# Patient Record
Sex: Female | Born: 1985 | Hispanic: No | Marital: Married | Smoking: Never smoker
Health system: Southern US, Community
[De-identification: ages and names within clinical notes are randomized; demographics above are authoritative.]

## PROBLEM LIST (undated history)

## (undated) DIAGNOSIS — D696 Thrombocytopenia, unspecified: Secondary | ICD-10-CM

## (undated) DIAGNOSIS — O24419 Gestational diabetes mellitus in pregnancy, unspecified control: Secondary | ICD-10-CM

## (undated) HISTORY — PX: NO PAST SURGERIES: SHX2092

## (undated) HISTORY — DX: Thrombocytopenia, unspecified: D69.6

---

## 2008-04-28 ENCOUNTER — Ambulatory Visit (HOSPITAL_COMMUNITY): Admission: RE | Admit: 2008-04-28 | Discharge: 2008-04-28 | Payer: Self-pay | Admitting: Obstetrics & Gynecology

## 2009-03-26 ENCOUNTER — Inpatient Hospital Stay (HOSPITAL_COMMUNITY): Admission: AD | Admit: 2009-03-26 | Discharge: 2009-03-28 | Payer: Self-pay | Admitting: Obstetrics & Gynecology

## 2010-08-28 ENCOUNTER — Ambulatory Visit: Payer: Self-pay | Admitting: Gynecology

## 2010-08-28 ENCOUNTER — Inpatient Hospital Stay (HOSPITAL_COMMUNITY): Admission: AD | Admit: 2010-08-28 | Discharge: 2010-08-28 | Payer: Self-pay | Admitting: Family Medicine

## 2010-11-09 ENCOUNTER — Inpatient Hospital Stay (HOSPITAL_COMMUNITY)
Admission: AD | Admit: 2010-11-09 | Discharge: 2010-11-09 | Payer: Self-pay | Source: Home / Self Care | Attending: Obstetrics and Gynecology | Admitting: Obstetrics and Gynecology

## 2010-11-18 LAB — URINALYSIS, ROUTINE W REFLEX MICROSCOPIC
Bilirubin Urine: NEGATIVE
Ketones, ur: NEGATIVE mg/dL
Leukocytes, UA: NEGATIVE
Nitrite: NEGATIVE
Protein, ur: NEGATIVE mg/dL
Specific Gravity, Urine: 1.025 (ref 1.005–1.030)
Urine Glucose, Fasting: NEGATIVE mg/dL
Urobilinogen, UA: 0.2 mg/dL (ref 0.0–1.0)
pH: 6 (ref 5.0–8.0)

## 2010-11-18 LAB — CBC
HCT: 38.2 % (ref 36.0–46.0)
Hemoglobin: 12.7 g/dL (ref 12.0–15.0)
MCH: 27.1 pg (ref 26.0–34.0)
MCHC: 33.2 g/dL (ref 30.0–36.0)
MCV: 81.6 fL (ref 78.0–100.0)
Platelets: 207 10*3/uL (ref 150–400)
RBC: 4.68 MIL/uL (ref 3.87–5.11)
RDW: 12.9 % (ref 11.5–15.5)
WBC: 5.5 10*3/uL (ref 4.0–10.5)

## 2010-11-18 LAB — WET PREP, GENITAL
Clue Cells Wet Prep HPF POC: NONE SEEN
Trich, Wet Prep: NONE SEEN
Yeast Wet Prep HPF POC: NONE SEEN

## 2010-11-18 LAB — GC/CHLAMYDIA PROBE AMP, GENITAL
Chlamydia, DNA Probe: NEGATIVE
GC Probe Amp, Genital: NEGATIVE

## 2010-11-18 LAB — URINE MICROSCOPIC-ADD ON

## 2010-11-18 LAB — HCG, QUANTITATIVE, PREGNANCY: hCG, Beta Chain, Quant, S: 5380 m[IU]/mL — ABNORMAL HIGH (ref <5)

## 2011-01-15 LAB — HCG, QUANTITATIVE, PREGNANCY: hCG, Beta Chain, Quant, S: 24 m[IU]/mL — ABNORMAL HIGH (ref <5)

## 2011-01-15 LAB — URINALYSIS, ROUTINE W REFLEX MICROSCOPIC
Bilirubin Urine: NEGATIVE
Glucose, UA: NEGATIVE mg/dL
Ketones, ur: NEGATIVE mg/dL
Leukocytes, UA: NEGATIVE
Nitrite: NEGATIVE
Protein, ur: NEGATIVE mg/dL
Specific Gravity, Urine: 1.01 (ref 1.005–1.030)
Urobilinogen, UA: 0.2 mg/dL (ref 0.0–1.0)
pH: 6 (ref 5.0–8.0)

## 2011-01-15 LAB — HCG, SERUM, QUALITATIVE: Preg, Serum: POSITIVE — AB

## 2011-01-15 LAB — WET PREP, GENITAL
Clue Cells Wet Prep HPF POC: NONE SEEN
Trich, Wet Prep: NONE SEEN
Yeast Wet Prep HPF POC: NONE SEEN

## 2011-01-15 LAB — CBC
HCT: 39.6 % (ref 36.0–46.0)
Hemoglobin: 13.2 g/dL (ref 12.0–15.0)
MCH: 27.9 pg (ref 26.0–34.0)
MCHC: 33.3 g/dL (ref 30.0–36.0)
MCV: 83.8 fL (ref 78.0–100.0)
Platelets: 235 10*3/uL (ref 150–400)
RBC: 4.73 MIL/uL (ref 3.87–5.11)
RDW: 13.6 % (ref 11.5–15.5)
WBC: 6.5 10*3/uL (ref 4.0–10.5)

## 2011-01-15 LAB — GC/CHLAMYDIA PROBE AMP, GENITAL
Chlamydia, DNA Probe: NEGATIVE
GC Probe Amp, Genital: NEGATIVE

## 2011-01-15 LAB — URINE MICROSCOPIC-ADD ON

## 2011-01-15 LAB — POCT PREGNANCY, URINE: Preg Test, Ur: NEGATIVE

## 2011-01-15 LAB — ABO/RH: ABO/RH(D): A POS

## 2011-02-11 LAB — CBC
HCT: 28.6 % — ABNORMAL LOW (ref 36.0–46.0)
HCT: 33.6 % — ABNORMAL LOW (ref 36.0–46.0)
Hemoglobin: 11.3 g/dL — ABNORMAL LOW (ref 12.0–15.0)
Hemoglobin: 9.7 g/dL — ABNORMAL LOW (ref 12.0–15.0)
MCHC: 33.6 g/dL (ref 30.0–36.0)
MCHC: 34 g/dL (ref 30.0–36.0)
MCV: 78.6 fL (ref 78.0–100.0)
MCV: 78.8 fL (ref 78.0–100.0)
Platelets: 130 10*3/uL — ABNORMAL LOW (ref 150–400)
Platelets: 142 10*3/uL — ABNORMAL LOW (ref 150–400)
RBC: 3.64 MIL/uL — ABNORMAL LOW (ref 3.87–5.11)
RBC: 4.26 MIL/uL (ref 3.87–5.11)
RDW: 14.9 % (ref 11.5–15.5)
RDW: 15.3 % (ref 11.5–15.5)
WBC: 7.6 10*3/uL (ref 4.0–10.5)
WBC: 9 10*3/uL (ref 4.0–10.5)

## 2011-02-11 LAB — RPR: RPR Ser Ql: NONREACTIVE

## 2011-04-26 ENCOUNTER — Inpatient Hospital Stay (HOSPITAL_COMMUNITY)
Admission: AD | Admit: 2011-04-26 | Discharge: 2011-04-26 | Disposition: A | Discharge status: Home / Self Care | Payer: Medicaid Other | Source: Ambulatory Visit | Attending: Obstetrics & Gynecology | Admitting: Obstetrics & Gynecology

## 2011-04-26 ENCOUNTER — Inpatient Hospital Stay (HOSPITAL_COMMUNITY): Payer: Medicaid Other

## 2011-04-26 DIAGNOSIS — O99891 Other specified diseases and conditions complicating pregnancy: Secondary | ICD-10-CM

## 2011-04-26 DIAGNOSIS — R109 Unspecified abdominal pain: Secondary | ICD-10-CM

## 2011-04-26 DIAGNOSIS — O9989 Other specified diseases and conditions complicating pregnancy, childbirth and the puerperium: Secondary | ICD-10-CM

## 2011-04-26 LAB — URINALYSIS, ROUTINE W REFLEX MICROSCOPIC
Nitrite: NEGATIVE
Specific Gravity, Urine: 1.015 (ref 1.005–1.030)
Urobilinogen, UA: 0.2 mg/dL (ref 0.0–1.0)
pH: 6 (ref 5.0–8.0)

## 2011-04-26 LAB — CBC
MCH: 27.4 pg (ref 26.0–34.0)
MCHC: 33.4 g/dL (ref 30.0–36.0)
MCV: 82 fL (ref 78.0–100.0)
Platelets: 206 10*3/uL (ref 150–400)
RDW: 13.3 % (ref 11.5–15.5)

## 2011-04-26 LAB — URINE MICROSCOPIC-ADD ON

## 2011-04-26 LAB — RPR: RPR: NONREACTIVE

## 2011-04-26 LAB — WET PREP, GENITAL: Clue Cells Wet Prep HPF POC: NONE SEEN

## 2011-04-26 LAB — HEPATITIS B SURFACE ANTIGEN: Hepatitis B Surface Ag: NEGATIVE

## 2011-04-26 LAB — POCT PREGNANCY, URINE: Preg Test, Ur: POSITIVE

## 2011-04-27 LAB — URINE CULTURE
Colony Count: 35000
Culture  Setup Time: 201206232212

## 2011-04-28 ENCOUNTER — Inpatient Hospital Stay (HOSPITAL_COMMUNITY)
Admission: EM | Admit: 2011-04-28 | Discharge: 2011-04-28 | Disposition: A | Discharge status: Home / Self Care | Payer: Medicaid Other | Source: Ambulatory Visit | Attending: Obstetrics & Gynecology | Admitting: Obstetrics & Gynecology

## 2011-04-28 DIAGNOSIS — N390 Urinary tract infection, site not specified: Secondary | ICD-10-CM

## 2011-04-28 DIAGNOSIS — O209 Hemorrhage in early pregnancy, unspecified: Secondary | ICD-10-CM

## 2011-04-28 DIAGNOSIS — O2390 Unspecified genitourinary tract infection in pregnancy, unspecified trimester: Secondary | ICD-10-CM

## 2011-10-07 ENCOUNTER — Ambulatory Visit: Payer: Medicaid Other | Admitting: Rehabilitative and Restorative Service Providers"

## 2011-10-29 ENCOUNTER — Encounter: Payer: Self-pay | Admitting: *Deleted

## 2011-10-29 ENCOUNTER — Encounter: Payer: Medicaid Other | Attending: Obstetrics and Gynecology | Admitting: *Deleted

## 2011-10-29 DIAGNOSIS — Z713 Dietary counseling and surveillance: Secondary | ICD-10-CM | POA: Insufficient documentation

## 2011-10-29 DIAGNOSIS — E119 Type 2 diabetes mellitus without complications: Secondary | ICD-10-CM | POA: Insufficient documentation

## 2011-10-29 NOTE — Progress Notes (Signed)
  Patient was seen on 10/29/2011 for Gestational Diabetes self-management class at the [facility]. The following learning objectives were met by the patient during this course:   States the definition of Gestational Diabetes  States why dietary management is important in controlling blood glucose  Describes the effects each nutrient has on blood glucose levels  Demonstrates ability to create a balanced meal plan  Demonstrates carbohydrate counting   States when to check blood glucose levels  Demonstrates proper blood glucose monitoring techniques  States the effect of stress and exercise on blood glucose levels  States the importance of limiting caffeine and abstaining from alcohol and smoking  Blood glucose monitor given: Accu Chek Nano BG Monitoring Kit  Lot # [id number] Exp: 12/03/2012 Blood glucose reading: 116  Patient instructed to monitor glucose levels: FBS: 60 - <90 2 hour: <120  *Patient received handouts:  Nutrition Diabetes and Pregnancy  Carbohydrate Counting List  Patient will be seen for follow-up as needed.

## 2011-10-29 NOTE — Patient Instructions (Signed)
Goals:  Check glucose levels per MD as instructed  Follow Gestational Diabetes Diet as instructed  Call for follow-up as needed    

## 2011-11-03 LAB — STREP B DNA PROBE: GBS: POSITIVE

## 2011-11-04 NOTE — L&D Delivery Note (Signed)
Delivery Note 26 yo g5 p2  39 4/7 week IUP SROM clear, GBS+ ampicillin given, GDM diet control, epidural for labor and delivery, lithotomy position, easy delivery of the head, LOA, with nuchal posterior arm, easy delivery of the shoulders, baby placed on pt abd, cord doubly clamped and cut. At 5:18 AM a viable female was delivered via Vaginal, Spontaneous Delivery  APGAR: 8, 8; weight 7 lb 5.3 oz (3325 g).   Placenta status: Intact, Spontaneous, schultze. Cord: 3 vessels.  Anesthesia: Epidural  Episiotomy: None Lacerations: 2nd degree;Perineal Suture Repair: 3.0 vicryl Est. Blood Loss (mL): 200  Mom to postpartum.  Baby to rooming in.  Elaine Kelly 11/28/2011, 5:51 AM

## 2011-11-25 ENCOUNTER — Inpatient Hospital Stay (HOSPITAL_COMMUNITY)
Admission: AD | Admit: 2011-11-25 | Discharge: 2011-11-25 | Disposition: A | Discharge status: Home / Self Care | Payer: Medicaid Other | Source: Ambulatory Visit | Attending: Obstetrics and Gynecology | Admitting: Obstetrics and Gynecology

## 2011-11-25 ENCOUNTER — Encounter (HOSPITAL_COMMUNITY): Payer: Self-pay | Admitting: *Deleted

## 2011-11-25 DIAGNOSIS — Z22330 Carrier of Group B streptococcus: Secondary | ICD-10-CM

## 2011-11-25 DIAGNOSIS — O99810 Abnormal glucose complicating pregnancy: Secondary | ICD-10-CM | POA: Insufficient documentation

## 2011-11-25 DIAGNOSIS — O24410 Gestational diabetes mellitus in pregnancy, diet controlled: Secondary | ICD-10-CM

## 2011-11-25 DIAGNOSIS — O99891 Other specified diseases and conditions complicating pregnancy: Secondary | ICD-10-CM | POA: Insufficient documentation

## 2011-11-25 DIAGNOSIS — E669 Obesity, unspecified: Secondary | ICD-10-CM

## 2011-11-25 HISTORY — DX: Gestational diabetes mellitus in pregnancy, unspecified control: O24.419

## 2011-11-25 LAB — AMNISURE RUPTURE OF MEMBRANE (ROM) NOT AT ARMC: Amnisure ROM: NEGATIVE

## 2011-11-25 NOTE — Discharge Instructions (Signed)
Braxton Hicks Contractions Pregnancy is commonly associated with contractions of the uterus throughout the pregnancy. Towards the end of pregnancy (32 to 34 weeks), these contractions (Braxton Hicks) can develop more often and may become more forceful. This is not true labor because these contractions do not result in opening (dilatation) and thinning of the cervix. They are sometimes difficult to tell apart from true labor because these contractions can be forceful and people have different pain tolerances. You should not feel embarrassed if you go to the hospital with false labor. Sometimes, the only way to tell if you are in true labor is for your caregiver to follow the changes in the cervix. How to tell the difference between true and false labor:  False labor.   The contractions of false labor are usually shorter, irregular and not as hard as those of true labor.   They are often felt in the front of the lower abdomen and in the groin.   They may leave with walking around or changing positions while lying down.   They get weaker and are shorter lasting as time goes on.   These contractions are usually irregular.   They do not usually become progressively stronger, regular and closer together as with true labor.   True labor.   Contractions in true labor last 30 to 70 seconds, become very regular, usually become more intense, and increase in frequency.   They do not go away with walking.   The discomfort is usually felt in the top of the uterus and spreads to the lower abdomen and low back.   True labor can be determined by your caregiver with an exam. This will show that the cervix is dilating and getting thinner.  If there are no prenatal problems or other health problems associated with the pregnancy, it is completely safe to be sent home with false labor and await the onset of true labor. HOME CARE INSTRUCTIONS   Keep up with your usual exercises and instructions.   Take  medications as directed.   Keep your regular prenatal appointment.   Eat and drink lightly if you think you are going into labor.   If BH contractions are making you uncomfortable:   Change your activity position from lying down or resting to walking/walking to resting.   Sit and rest in a tub of warm water.   Drink 2 to 3 glasses of water. Dehydration may cause B-H contractions.   Do slow and deep breathing several times an hour.  SEEK IMMEDIATE MEDICAL CARE IF:   Your contractions continue to become stronger, more regular, and closer together.   You have a gushing, burst or leaking of fluid from the vagina.   An oral temperature above 102 F (38.9 C) develops.   You have passage of blood-tinged mucus.   You develop vaginal bleeding.   You develop continuous belly (abdominal) pain.   You have low back pain that you never had before.   You feel the baby's head pushing down causing pelvic pressure.   The baby is not moving as much as it used to.  Document Released: 10/20/2005 Document Revised: 07/02/2011 Document Reviewed: 04/13/2009 Exit[employer] Patient Information 2012 [employer].Fetal Movement Counts Patient Name: __________________________________________________ Patient Due Date: ____________________ Kick counts is highly recommended in high risk pregnancies, but it is a good idea for every pregnant woman to do. Start counting fetal movements at 28 weeks of the pregnancy. Fetal movements increase after eating a full meal or eating   or drinking something sweet (the blood sugar is higher). It is also important to drink plenty of fluids (well hydrated) before doing the count. Lie on your left side because it helps with the circulation or you can sit in a comfortable chair with your arms over your belly (abdomen) with no distractions around you. DOING THE COUNT  Try to do the count the same time of day each time you do it.   Mark the day and time, then see how long it takes  for you to feel 10 movements (kicks, flutters, swishes, rolls). You should have at least 10 movements within 2 hours. You will most likely feel 10 movements in much less than 2 hours. If you do not, wait an hour and count again. After a couple of days you will see a pattern.   What you are looking for is a change in the pattern or not enough counts in 2 hours. Is it taking longer in time to reach 10 movements?  SEEK MEDICAL CARE IF:  You feel less than 10 counts in 2 hours. Tried twice.   No movement in one hour.   The pattern is changing or taking longer each day to reach 10 counts in 2 hours.   You feel the baby is not moving as it usually does.  Date: ____________ Movements: ____________ Start time: ____________ Finish time: ____________  Date: ____________ Movements: ____________ Start time: ____________ Finish time: ____________ Date: ____________ Movements: ____________ Start time: ____________ Finish time: ____________ Date: ____________ Movements: ____________ Start time: ____________ Finish time: ____________ Date: ____________ Movements: ____________ Start time: ____________ Finish time: ____________ Date: ____________ Movements: ____________ Start time: ____________ Finish time: ____________ Date: ____________ Movements: ____________ Start time: ____________ Finish time: ____________ Date: ____________ Movements: ____________ Start time: ____________ Finish time: ____________  Date: ____________ Movements: ____________ Start time: ____________ Finish time: ____________ Date: ____________ Movements: ____________ Start time: ____________ Finish time: ____________ Date: ____________ Movements: ____________ Start time: ____________ Finish time: ____________ Date: ____________ Movements: ____________ Start time: ____________ Finish time: ____________ Date: ____________ Movements: ____________ Start time: ____________ Finish time: ____________ Date: ____________ Movements: ____________  Start time: ____________ Finish time: ____________ Date: ____________ Movements: ____________ Start time: ____________ Finish time: ____________  Date: ____________ Movements: ____________ Start time: ____________ Finish time: ____________ Date: ____________ Movements: ____________ Start time: ____________ Finish time: ____________ Date: ____________ Movements: ____________ Start time: ____________ Finish time: ____________ Date: ____________ Movements: ____________ Start time: ____________ Finish time: ____________ Date: ____________ Movements: ____________ Start time: ____________ Finish time: ____________ Date: ____________ Movements: ____________ Start time: ____________ Finish time: ____________ Date: ____________ Movements: ____________ Start time: ____________ Finish time: ____________  Date: ____________ Movements: ____________ Start time: ____________ Finish time: ____________ Date: ____________ Movements: ____________ Start time: ____________ Finish time: ____________ Date: ____________ Movements: ____________ Start time: ____________ Finish time: ____________ Date: ____________ Movements: ____________ Start time: ____________ Finish time: ____________ Date: ____________ Movements: ____________ Start time: ____________ Finish time: ____________ Date: ____________ Movements: ____________ Start time: ____________ Finish time: ____________ Date: ____________ Movements: ____________ Start time: ____________ Finish time: ____________  Date: ____________ Movements: ____________ Start time: ____________ Finish time: ____________ Date: ____________ Movements: ____________ Start time: ____________ Finish time: ____________ Date: ____________ Movements: ____________ Start time: ____________ Finish time: ____________ Date: ____________ Movements: ____________ Start time: ____________ Finish time: ____________ Date: ____________ Movements: ____________ Start time: ____________ Finish time:  ____________ Date: ____________ Movements: ____________ Start time: ____________ Finish time: ____________ Date: ____________ Movements: ____________ Start time: ____________ Finish time: ____________  Date: ____________ Movements: ____________ Start   time: ____________ Finish time: ____________ Date: ____________ Movements: ____________ Start time: ____________ Finish time: ____________ Date: ____________ Movements: ____________ Start time: ____________ Finish time: ____________ Date: ____________ Movements: ____________ Start time: ____________ Finish time: ____________ Date: ____________ Movements: ____________ Start time: ____________ Finish time: ____________ Date: ____________ Movements: ____________ Start time: ____________ Finish time: ____________ Date: ____________ Movements: ____________ Start time: ____________ Finish time: ____________  Date: ____________ Movements: ____________ Start time: ____________ Finish time: ____________ Date: ____________ Movements: ____________ Start time: ____________ Finish time: ____________ Date: ____________ Movements: ____________ Start time: ____________ Finish time: ____________ Date: ____________ Movements: ____________ Start time: ____________ Finish time: ____________ Date: ____________ Movements: ____________ Start time: ____________ Finish time: ____________ Date: ____________ Movements: ____________ Start time: ____________ Finish time: ____________ Date: ____________ Movements: ____________ Start time: ____________ Finish time: ____________  Date: ____________ Movements: ____________ Start time: ____________ Finish time: ____________ Date: ____________ Movements: ____________ Start time: ____________ Finish time: ____________ Date: ____________ Movements: ____________ Start time: ____________ Finish time: ____________ Date: ____________ Movements: ____________ Start time: ____________ Finish time: ____________ Date: ____________ Movements:  ____________ Start time: ____________ Finish time: ____________ Date: ____________ Movements: ____________ Start time: ____________ Finish time: ____________ Document Released: 11/19/2006 Document Revised: 07/02/2011 Document Reviewed: 05/22/2009 [employer] Patient Information 2012 [employer]. 

## 2011-11-25 NOTE — Progress Notes (Signed)
Leaking fluid since 1700, clear fluid. contractions

## 2011-11-25 NOTE — ED Provider Notes (Signed)
History     Chief Complaint  Patient presents with  . Rupture of Membranes   HPI Comments: Pt is a G5P2 at [redacted]w[redacted]d with c/o leaking fluid? Denies any VB, unsure about ctx, +FM.  Pregnancy significant for:  1. Gest diab - det controlled      Past Medical History  Diagnosis Date  . Diabetes mellitus   . Gestational diabetes     History reviewed. No pertinent past surgical history.  Family History  Problem Relation Age of Onset  . Hypertension Father     History  Substance Use Topics  . Smoking status: Never Smoker   . Smokeless tobacco: Never Used  . Alcohol Use: No    Allergies: No Known Allergies  Prescriptions prior to admission  Medication Sig Dispense Refill  . acetaminophen (TYLENOL) 325 MG tablet Take 650 mg by mouth every 6 (six) hours as needed. For pain        Review of Systems  All other systems reviewed and are negative.   Physical Exam   Blood pressure 130/72, pulse 87, temperature 98.5 F (36.9 C), resp. rate 20, height 5' 5.5" (1.664 m), weight 99.791 kg (220 lb), SpO2 99.00%.  Physical Exam  Nursing note and vitals reviewed. Constitutional: She is oriented to person, place, and time. She appears well-developed and well-nourished.  HENT:  Head: Normocephalic.  Neck: Normal range of motion.  Cardiovascular: Normal rate.   Respiratory: Effort normal.  GI: Soft.  Genitourinary: Vagina normal. No vaginal discharge found.       Perineum was dry and amnisure neg Cx=2/th/high/ballottable   Musculoskeletal: Normal range of motion.  Neurological: She is alert and oriented to person, place, and time.  Skin: Skin is warm and dry.  Psychiatric: She has a normal mood and affect. Her behavior is normal.   FHR 140 reactive CAT 1 Toco quiet  MAU Course  Procedures    Assessment and Plan  IUP at 39weeks R/o rupture - amnisure neg FHR reassuring   D/C home [facility] F/u as scheduled in office on Thursday  Tachina Spoonemore M 11/25/2011, 11:21 PM

## 2011-11-25 NOTE — Progress Notes (Signed)
Pt started having pain about 1700 today. Pt rates this pain a 5.

## 2011-11-28 ENCOUNTER — Encounter (HOSPITAL_COMMUNITY): Payer: Self-pay | Admitting: Anesthesiology

## 2011-11-28 ENCOUNTER — Encounter (HOSPITAL_COMMUNITY): Payer: Self-pay

## 2011-11-28 ENCOUNTER — Inpatient Hospital Stay (HOSPITAL_COMMUNITY)
Admission: AD | Admit: 2011-11-28 | Discharge: 2011-11-30 | DRG: 775 | Disposition: A | Discharge status: Home / Self Care | Payer: Medicaid Other | Source: Ambulatory Visit | Attending: Obstetrics and Gynecology | Admitting: Obstetrics and Gynecology

## 2011-11-28 ENCOUNTER — Inpatient Hospital Stay (HOSPITAL_COMMUNITY): Payer: Medicaid Other | Admitting: Anesthesiology

## 2011-11-28 DIAGNOSIS — D696 Thrombocytopenia, unspecified: Secondary | ICD-10-CM | POA: Diagnosis present

## 2011-11-28 DIAGNOSIS — O99892 Other specified diseases and conditions complicating childbirth: Secondary | ICD-10-CM | POA: Diagnosis present

## 2011-11-28 DIAGNOSIS — D689 Coagulation defect, unspecified: Secondary | ICD-10-CM | POA: Diagnosis present

## 2011-11-28 DIAGNOSIS — O9912 Other diseases of the blood and blood-forming organs and certain disorders involving the immune mechanism complicating childbirth: Secondary | ICD-10-CM | POA: Diagnosis present

## 2011-11-28 DIAGNOSIS — Z22330 Carrier of Group B streptococcus: Secondary | ICD-10-CM

## 2011-11-28 DIAGNOSIS — IMO0001 Reserved for inherently not codable concepts without codable children: Secondary | ICD-10-CM

## 2011-11-28 DIAGNOSIS — Z603 Acculturation difficulty: Secondary | ICD-10-CM

## 2011-11-28 DIAGNOSIS — O24410 Gestational diabetes mellitus in pregnancy, diet controlled: Secondary | ICD-10-CM

## 2011-11-28 DIAGNOSIS — O99814 Abnormal glucose complicating childbirth: Secondary | ICD-10-CM | POA: Diagnosis present

## 2011-11-28 LAB — CBC
HCT: 39.4 % (ref 36.0–46.0)
MCH: 28.4 pg (ref 26.0–34.0)
MCH: 27.8 pg (ref 26.0–34.0)
MCHC: 33.7 g/dL (ref 30.0–36.0)
MCV: 84.1 fL (ref 78.0–100.0)
MCV: 84.2 fL (ref 78.0–100.0)
Platelets: 126 10*3/uL — ABNORMAL LOW (ref 150–400)
Platelets: 129 10*3/uL — ABNORMAL LOW (ref 150–400)
RDW: 13.8 % (ref 11.5–15.5)
RDW: 13.9 % (ref 11.5–15.5)
WBC: 9.3 10*3/uL (ref 4.0–10.5)

## 2011-11-28 LAB — COMPREHENSIVE METABOLIC PANEL
Albumin: 2.7 g/dL — ABNORMAL LOW (ref 3.5–5.2)
BUN: 4 mg/dL — ABNORMAL LOW (ref 6–23)
Calcium: 9.4 mg/dL (ref 8.4–10.5)
Creatinine, Ser: 0.43 mg/dL — ABNORMAL LOW (ref 0.50–1.10)
Total Bilirubin: 0.2 mg/dL — ABNORMAL LOW (ref 0.3–1.2)
Total Protein: 6.1 g/dL (ref 6.0–8.3)

## 2011-11-28 LAB — RAPID HIV SCREEN (WH-MAU): Rapid HIV Screen: NONREACTIVE

## 2011-11-28 LAB — HIV ANTIBODY (ROUTINE TESTING W REFLEX): HIV: NONREACTIVE

## 2011-11-28 MED ORDER — OXYCODONE-ACETAMINOPHEN 5-325 MG PO TABS
1.0000 | ORAL_TABLET | ORAL | Status: DC | PRN
  Administered 2011-11-28 – 2011-11-29 (×2): 1 via ORAL
  Administered 2011-11-30: 2 via ORAL
  Administered 2011-11-30: 1 via ORAL
  Filled 2011-11-28 (×2): qty 1
  Filled 2011-11-28: qty 2
  Filled 2011-11-28: qty 1

## 2011-11-28 MED ORDER — LACTATED RINGERS IV SOLN: 500.0000 mL | Freq: Once | INTRAVENOUS | Status: DC

## 2011-11-28 MED ORDER — DIPHENHYDRAMINE HCL 50 MG/ML IJ SOLN: 12.5000 mg | INTRAMUSCULAR | Status: DC | PRN

## 2011-11-28 MED ORDER — FENTANYL 2.5 MCG/ML BUPIVACAINE 1/10 % EPIDURAL INFUSION (WH - ANES)
14.0000 mL/h | INTRAMUSCULAR | Status: DC
  Filled 2011-11-28: qty 60

## 2011-11-28 MED ORDER — NALBUPHINE SYRINGE 5 MG/0.5 ML: 10.0000 mg | INJECTION | INTRAMUSCULAR | Status: DC | PRN

## 2011-11-28 MED ORDER — OXYTOCIN 20 UNITS IN LACTATED RINGERS INFUSION - SIMPLE
125.0000 mL/h | Freq: Once | INTRAVENOUS | Status: AC
  Administered 2011-11-28: 500 mL/h via INTRAVENOUS

## 2011-11-28 MED ORDER — PENICILLIN G POTASSIUM 5000000 UNITS IJ SOLR
2.5000 10*6.[IU] | INTRAVENOUS | Status: DC
  Filled 2011-11-28 (×2): qty 2.5

## 2011-11-28 MED ORDER — PHENYLEPHRINE 40 MCG/ML (10ML) SYRINGE FOR IV PUSH (FOR BLOOD PRESSURE SUPPORT)
80.0000 ug | PREFILLED_SYRINGE | INTRAVENOUS | Status: DC | PRN
  Filled 2011-11-28: qty 5

## 2011-11-28 MED ORDER — IBUPROFEN 600 MG PO TABS
600.0000 mg | ORAL_TABLET | Freq: Four times a day (QID) | ORAL | Status: DC
  Administered 2011-11-28 – 2011-11-30 (×8): 600 mg via ORAL
  Filled 2011-11-28 (×8): qty 1

## 2011-11-28 MED ORDER — EPHEDRINE 5 MG/ML INJ
10.0000 mg | INTRAVENOUS | Status: DC | PRN
  Filled 2011-11-28: qty 4

## 2011-11-28 MED ORDER — LIDOCAINE HCL (PF) 1 % IJ SOLN
30.0000 mL | INTRAMUSCULAR | Status: DC | PRN
  Filled 2011-11-28: qty 30

## 2011-11-28 MED ORDER — DIPHENHYDRAMINE HCL 25 MG PO CAPS: 25.0000 mg | ORAL_CAPSULE | Freq: Four times a day (QID) | ORAL | Status: DC | PRN

## 2011-11-28 MED ORDER — IBUPROFEN 600 MG PO TABS: 600.0000 mg | ORAL_TABLET | Freq: Four times a day (QID) | ORAL | Status: DC | PRN

## 2011-11-28 MED ORDER — LIDOCAINE HCL 1.5 % IJ SOLN
INTRAMUSCULAR | Status: DC | PRN
  Administered 2011-11-28 (×2): 5 mL via EPIDURAL

## 2011-11-28 MED ORDER — PENICILLIN G POTASSIUM 5000000 UNITS IJ SOLR
5.0000 10*6.[IU] | Freq: Once | INTRAMUSCULAR | Status: DC
  Filled 2011-11-28: qty 5

## 2011-11-28 MED ORDER — SODIUM CHLORIDE 0.9 % IV SOLN
2.0000 g | Freq: Once | INTRAVENOUS | Status: AC
  Administered 2011-11-28: 2 g via INTRAVENOUS
  Filled 2011-11-28: qty 2000

## 2011-11-28 MED ORDER — ONDANSETRON HCL 4 MG/2ML IJ SOLN: 4.0000 mg | Freq: Four times a day (QID) | INTRAMUSCULAR | Status: DC | PRN

## 2011-11-28 MED ORDER — CITRIC ACID-SODIUM CITRATE 334-500 MG/5ML PO SOLN: 30.0000 mL | ORAL | Status: DC | PRN

## 2011-11-28 MED ORDER — FLEET ENEMA 7-19 GM/118ML RE ENEM: 1.0000 | ENEMA | RECTAL | Status: DC | PRN

## 2011-11-28 MED ORDER — LACTATED RINGERS IV SOLN: 500.0000 mL | INTRAVENOUS | Status: DC | PRN

## 2011-11-28 MED ORDER — SENNOSIDES-DOCUSATE SODIUM 8.6-50 MG PO TABS
2.0000 | ORAL_TABLET | Freq: Every day | ORAL | Status: DC
  Administered 2011-11-28 – 2011-11-29 (×2): 2 via ORAL

## 2011-11-28 MED ORDER — BENZOCAINE-MENTHOL 20-0.5 % EX AERO
INHALATION_SPRAY | CUTANEOUS | Status: AC
  Administered 2011-11-28: 21:00:00
  Filled 2011-11-28: qty 56

## 2011-11-28 MED ORDER — ZOLPIDEM TARTRATE 5 MG PO TABS: 5.0000 mg | ORAL_TABLET | Freq: Every evening | ORAL | Status: DC | PRN

## 2011-11-28 MED ORDER — ACETAMINOPHEN 325 MG PO TABS: 650.0000 mg | ORAL_TABLET | ORAL | Status: DC | PRN

## 2011-11-28 MED ORDER — SIMETHICONE 80 MG PO CHEW: 80.0000 mg | CHEWABLE_TABLET | ORAL | Status: DC | PRN

## 2011-11-28 MED ORDER — ONDANSETRON HCL 4 MG PO TABS: 4.0000 mg | ORAL_TABLET | ORAL | Status: DC | PRN

## 2011-11-28 MED ORDER — TETANUS-DIPHTH-ACELL PERTUSSIS 5-2.5-18.5 LF-MCG/0.5 IM SUSP: 0.5000 mL | Freq: Once | INTRAMUSCULAR | Status: DC

## 2011-11-28 MED ORDER — OXYTOCIN BOLUS FROM INFUSION
500.0000 mL | Freq: Once | INTRAVENOUS | Status: DC
  Filled 2011-11-28: qty 500
  Filled 2011-11-28: qty 1000

## 2011-11-28 MED ORDER — LANOLIN HYDROUS EX OINT: TOPICAL_OINTMENT | CUTANEOUS | Status: DC | PRN

## 2011-11-28 MED ORDER — PHENYLEPHRINE 40 MCG/ML (10ML) SYRINGE FOR IV PUSH (FOR BLOOD PRESSURE SUPPORT): 80.0000 ug | PREFILLED_SYRINGE | INTRAVENOUS | Status: DC | PRN

## 2011-11-28 MED ORDER — OXYCODONE-ACETAMINOPHEN 5-325 MG PO TABS: 2.0000 | ORAL_TABLET | ORAL | Status: DC | PRN

## 2011-11-28 MED ORDER — BENZOCAINE-MENTHOL 20-0.5 % EX AERO: 1.0000 "application " | INHALATION_SPRAY | CUTANEOUS | Status: DC | PRN

## 2011-11-28 MED ORDER — FENTANYL 2.5 MCG/ML BUPIVACAINE 1/10 % EPIDURAL INFUSION (WH - ANES)
INTRAMUSCULAR | Status: DC | PRN
  Administered 2011-11-28: 14 mL/h via EPIDURAL

## 2011-11-28 MED ORDER — WITCH HAZEL-GLYCERIN EX PADS: 1.0000 "application " | MEDICATED_PAD | CUTANEOUS | Status: DC | PRN

## 2011-11-28 MED ORDER — LACTATED RINGERS IV SOLN
INTRAVENOUS | Status: DC
  Administered 2011-11-28 (×2): via INTRAVENOUS

## 2011-11-28 MED ORDER — EPHEDRINE 5 MG/ML INJ: 10.0000 mg | INTRAVENOUS | Status: DC | PRN

## 2011-11-28 MED ORDER — DIBUCAINE 1 % RE OINT: 1.0000 "application " | TOPICAL_OINTMENT | RECTAL | Status: DC | PRN

## 2011-11-28 MED ORDER — ONDANSETRON HCL 4 MG/2ML IJ SOLN: 4.0000 mg | INTRAMUSCULAR | Status: DC | PRN

## 2011-11-28 MED ORDER — PRENATAL MULTIVITAMIN CH
1.0000 | ORAL_TABLET | Freq: Every day | ORAL | Status: DC
Start: 2011-11-28 — End: 2011-11-30
  Administered 2011-11-28 – 2011-11-30 (×3): 1 via ORAL
  Filled 2011-11-28 (×3): qty 1

## 2011-11-28 NOTE — Anesthesia Postprocedure Evaluation (Signed)
  Anesthesia Post-op Note  Patient: Elaine Kelly  Procedure(s) Performed: * No procedures listed *  Patient Location: Mother/Baby  Anesthesia Type: Epidural  Level of Consciousness: alert  and oriented  Airway and Oxygen Therapy: Patient Spontanous Breathing  Post-op Pain: mild  Post-op Assessment: Patient's Cardiovascular Status Stable and Respiratory Function Stable  Post-op Vital Signs: stable  Complications: No apparent anesthesia complications

## 2011-11-28 NOTE — Addendum Note (Signed)
Addendum  created 11/28/11 1121 by Fanny Dance, CRNA   Modules edited:Charges VN, Notes Section

## 2011-11-28 NOTE — Anesthesia Preprocedure Evaluation (Signed)
Anesthesia Evaluation  Patient identified by MRN, date of birth, ID band Patient awake    Reviewed: Allergy & Precautions, H&P , NPO status , Patient's Chart, lab work & pertinent test results  Airway Mallampati: II TM Distance: >3 FB Neck ROM: full    Dental No notable dental hx.    Pulmonary neg pulmonary ROS,    Pulmonary exam normal       Cardiovascular neg cardio ROS     Neuro/Psych Negative Neurological ROS  Negative Psych ROS   GI/Hepatic negative GI ROS, Neg liver ROS,   Endo/Other  Morbid obesity  Renal/GU negative Renal ROS  Genitourinary negative   Musculoskeletal negative musculoskeletal ROS (+)   Abdominal (+) obese,   Peds negative pediatric ROS (+)  Hematology negative hematology ROS (+)   Anesthesia Other Findings   Reproductive/Obstetrics (+) Pregnancy                           Anesthesia Physical Anesthesia Plan  ASA: III  Anesthesia Plan: Epidural   Post-op Pain Management:    Induction:   Airway Management Planned:   Additional Equipment:   Intra-op Plan:   Post-operative Plan:   Informed Consent: I have reviewed the patients History and Physical, chart, labs and discussed the procedure including the risks, benefits and alternatives for the proposed anesthesia with the patient or authorized representative who has indicated his/her understanding and acceptance.     Plan Discussed with:   Anesthesia Plan Comments:         Anesthesia Quick Evaluation  

## 2011-11-28 NOTE — Progress Notes (Signed)
Pt states she had a gush of clear fluid at 0130

## 2011-11-28 NOTE — Anesthesia Postprocedure Evaluation (Signed)
Anesthesia Post Note  Patient: Elaine Kelly  Procedure(s) Performed: * No procedures listed *  Anesthesia type: Epidural  Patient location: Mother/Baby  Post pain: Pain level controlled  Post assessment: Post-op Vital signs reviewed  Last Vitals:  Filed Vitals:   11/28/11 0632  BP: 115/70  Pulse: 77  Temp:   Resp: 18    Post vital signs: Reviewed  Level of consciousness: awake  Complications: No apparent anesthesia complications

## 2011-11-28 NOTE — Anesthesia Procedure Notes (Signed)
Epidural Patient location during procedure: OB Start time: 11/28/2011 3:48 AM End time: 11/28/2011 3:53 AM Reason for block: procedure for pain  Staffing Anesthesiologist: Sandrea Hughs Performed by: anesthesiologist   Preanesthetic Checklist Completed: patient identified, site marked, surgical consent, pre-op evaluation, timeout performed, IV checked, risks and benefits discussed and monitors and equipment checked  Epidural Patient position: sitting Prep: site prepped and draped and DuraPrep Patient monitoring: continuous pulse ox and blood pressure Approach: midline Injection technique: LOR air  Needle:  Needle type: Tuohy  Needle gauge: 17 G Needle length: 9 cm Needle insertion depth: 8 cm Catheter type: closed end flexible Catheter size: 19 Gauge Catheter at skin depth: 14 cm Test dose: negative and 1.5% lidocaine  Assessment Sensory level: T8 Events: blood not aspirated, injection not painful, no injection resistance, negative IV test and no paresthesia

## 2011-11-28 NOTE — H&P (Signed)
Elaine Kelly is a 26 y.o. female presenting for c/o of leaking water since 0130 a lot of water, denies vag bleeding, with +FM. Contractions since 0900 Maternal Medical History:  Reason for admission: Reason for admission: rupture of membranes and contractions.  Contractions: Onset was yesterday.   Frequency: regular.   Perceived severity is moderate.    Fetal activity: Perceived fetal activity is normal.   Last perceived fetal movement was within the past hour.      OB History    Grav Para Term Preterm Abortions TAB SAB Ect Mult Living   5 2 2  0 2 0 2 0 0 2     Past Medical History  Diagnosis Date  . Diabetes mellitus   . Gestational diabetes    No past surgical history on file. Family History: family history includes Hypertension in her father. Social History:  reports that she has never smoked. She has never used smokeless tobacco. She reports that she does not drink alcohol. Her drug history not on file.  ROS  Dilation: 7 Effacement (%): 100 Station: -2 Exam by:: Elaine Kelly,CNM Blood pressure 112/69, pulse 102, temperature 97.9 F (36.6 C), temperature source Oral, resp. rate 20, height 5' 5.5" (1.664 m), weight 222 lb (100.699 kg), SpO2 99.00%. Exam Physical Exam Lungs clear bilaterally ap regular, abd soft, gravid, nt, SSE mod amount mucus, palpated hair, trace edema lower legs Fhts  Category 1 Contractions mod q 1-5, abd soft, nontender between contractions, tense with contractions EFW 7 #12 Prenatal labs: ABO, Rh: --/--/A POS (06/23 1920) Antibody:  neg Rubella:  Immune RPR:   NR HBsAg:   Neg HIV:   Neg GBS:   Positive urine  Assessment/Plan: 39 4/7 week IUP SROM Clear Active labor  GDM diet control Plans epidural, IV pitocin if indicated, [place] now, collaboration with Dr. Estanislado Kelly. Elaine Kelly 11/28/2011, 2:53 AM

## 2011-11-28 NOTE — Progress Notes (Signed)
UR chart review completed.  

## 2011-11-29 LAB — CBC
Hemoglobin: 12.5 g/dL (ref 12.0–15.0)
MCH: 28.3 pg (ref 26.0–34.0)
MCHC: 33.2 g/dL (ref 30.0–36.0)
RDW: 13.9 % (ref 11.5–15.5)

## 2011-11-29 LAB — GLUCOSE, CAPILLARY: Glucose-Capillary: 147 mg/dL — ABNORMAL HIGH (ref 70–99)

## 2011-11-29 NOTE — Progress Notes (Signed)
Post Partum Day 1 Subjective: Reports feeling well.  Ambulating, voiding and tol po liquids and solids without difficulty.  Denies weakness or dizziness.  Is breast and bottlefeeding without difficulty.  Passing flatus but no BM yet.  Objective: Blood pressure 107/70, pulse 87, temperature 98.7 F (37.1 C), temperature source Oral, resp. rate 17, height 5' 5.5" (1.664 m), weight 100.699 kg (222 lb), SpO2 99.00%, unknown if currently breastfeeding. Filed Vitals:   11/28/11 1220 11/28/11 2008 11/29/11 0531 11/29/11 1447  BP: 114/75 105/71 110/76 107/70  Pulse: 72 85 85 87  Temp: 97.1 F (36.2 C) 98.5 F (36.9 C) 98.3 F (36.8 C) 98.7 F (37.1 C)  TempSrc: Oral Oral Oral Oral  Resp: 20 20 18 17   Height:      Weight:      SpO2: 100% 99%      Physical Exam:  General: alert, cooperative and no distress Heart:  RRR Lungs:  CTA bilat Abd:  Soft, non-tender with pos BS x 4 quads Lochia: appropriate, sm rubra Uterine Fundus: firm, non-tender 2 below umbilicus Incision: healing well, perineum intact. DVT Evaluation: No evidence of DVT seen on physical exam. Negative Homan's sign bilat. No significant calf/ankle edema.   Basename 11/29/11 0546 11/28/11 0656  HGB 12.5 13.0  HCT 37.6 39.4    Assessment/Plan: Stable s/p vaginal delivery  Anticipate discharge tomorrow Continue current care   LOS: 1 day   Angelette Ganus O. 11/29/2011, 3:59 PM

## 2011-11-30 DIAGNOSIS — Z603 Acculturation difficulty: Secondary | ICD-10-CM

## 2011-11-30 DIAGNOSIS — D696 Thrombocytopenia, unspecified: Secondary | ICD-10-CM | POA: Diagnosis present

## 2011-11-30 MED ORDER — IBUPROFEN 600 MG PO TABS: 600.0000 mg | ORAL_TABLET | Freq: Four times a day (QID) | ORAL | Status: AC

## 2011-11-30 NOTE — Discharge Summary (Signed)
Obstetric Discharge Summary Reason for Admission: onset of labor and rupture of membranes Prenatal Procedures: NST and ultrasound Intrapartum Procedures: spontaneous vaginal delivery, GBS prophylaxis and epidural Postpartum Procedures: none Complications-Operative and Postpartum: 2nd degree perineal laceration Hemoglobin  Date Value Range Status  11/29/2011 12.5  12.0-15.0 (g/dL) Final     HCT  Date Value Range Status  11/29/2011 37.6  36.0-46.0 (%) Final  .. Results for orders placed during the hospital encounter of 11/28/11 (from the past 72 hour(s))  HIV ANTIBODY (ROUTINE TESTING)     Status: Normal      Component Value Range Comment   HIV Non-reactive   rapid HIV  CBC     Status: Abnormal   Collection Time   11/28/11  2:50 AM      Component Value Range Comment   WBC 7.2  4.0 - 10.5 (K/uL)    RBC 4.90  3.87 - 5.11 (MIL/uL)    Hemoglobin 13.9  12.0 - 15.0 (g/dL)    HCT 14.7  82.9 - 56.2 (%)    MCV 84.1  78.0 - 100.0 (fL)    MCH 28.4  26.0 - 34.0 (pg)    MCHC 33.7  30.0 - 36.0 (g/dL)    RDW 13.0  86.5 - 78.4 (%)    Platelets 126 (*) 150 - 400 (K/uL)   RPR     Status: Normal   Collection Time   11/28/11  2:50 AM      Component Value Range Comment   RPR NON REACTIVE  NON REACTIVE    COMPREHENSIVE METABOLIC PANEL     Status: Abnormal   Collection Time   11/28/11  2:50 AM      Component Value Range Comment   Sodium 136  135 - 145 (mEq/L)    Potassium 3.8  3.5 - 5.1 (mEq/L)    Chloride 102  96 - 112 (mEq/L)    CO2 22  19 - 32 (mEq/L)    Glucose, Bld 86  70 - 99 (mg/dL)    BUN 4 (*) 6 - 23 (mg/dL)    Creatinine, Ser 6.96 (*) 0.50 - 1.10 (mg/dL)    Calcium 9.4  8.4 - 10.5 (mg/dL)    Total Protein 6.1  6.0 - 8.3 (g/dL)    Albumin 2.7 (*) 3.5 - 5.2 (g/dL)    AST 16  0 - 37 (U/L)    ALT 12  0 - 35 (U/L)    Alkaline Phosphatase 207 (*) 39 - 117 (U/L)    Total Bilirubin 0.2 (*) 0.3 - 1.2 (mg/dL)    GFR calc non Af Amer >90  >90 (mL/min)    GFR calc Af Amer >90  >90 (mL/min)     RAPID HIV SCREEN [facility])     Status: Normal   Collection Time   11/28/11  2:50 AM      Component Value Range Comment   SUDS Rapid HIV Screen NON REACTIVE  NON REACTIVE    CBC     Status: Abnormal   Collection Time   11/28/11  6:56 AM      Component Value Range Comment   WBC 9.3  4.0 - 10.5 (K/uL)    RBC 4.68  3.87 - 5.11 (MIL/uL)    Hemoglobin 13.0  12.0 - 15.0 (g/dL)    HCT 29.5  28.4 - 13.2 (%)    MCV 84.2  78.0 - 100.0 (fL)    MCH 27.8  26.0 - 34.0 (pg)    MCHC 33.0  30.0 - 36.0 (g/dL)    RDW 16.1  09.6 - 04.5 (%)    Platelets 129 (*) 150 - 400 (K/uL)   CBC     Status: Abnormal   Collection Time   11/29/11  5:46 AM      Component Value Range Comment   WBC 8.1  4.0 - 10.5 (K/uL)    RBC 4.42  3.87 - 5.11 (MIL/uL)    Hemoglobin 12.5  12.0 - 15.0 (g/dL)    HCT 40.9  81.1 - 91.4 (%)    MCV 85.1  78.0 - 100.0 (fL)    MCH 28.3  26.0 - 34.0 (pg)    MCHC 33.2  30.0 - 36.0 (g/dL)    RDW 78.2  95.6 - 21.3 (%)    Platelets 138 (*) 150 - 400 (K/uL)   GLUCOSE, CAPILLARY     Status: Abnormal   Collection Time   11/29/11  8:24 PM      Component Value Range Comment   Glucose-Capillary 147 (*) 70 - 99 (mg/dL)    Comment 1 Notify RN     GLUCOSE, CAPILLARY     Status: Normal   Collection Time   11/30/11  6:03 AM      Component Value Range Comment   Glucose-Capillary 84  70 - 99 (mg/dL)    Comment 1 Notify RN     Hospital Course:  Pt admitted in active labor w/ rupture of membranes.  Received Ampicillin for GBS prophylaxis secondary to advanced dilatation.  Pt received epidural.  No additional augmentation necessary before SVD.  PP course unremarkable.  Fasting CBG WNL today--rec'd continued diligence w/ CHO modified diet.  Will recheck CBC at 6 week visit secondary to thrombocytopenia.  Abstinence until contraception decided on.  Motrin Rx for pain.  VB stable.    Discharge Diagnoses: Term Pregnancy-delivered and Lactating; Gestational Diabetes--diet controlled; Language Barrier ([place]);  GBS positive; h/o SAB x2; obese; thrombocytopenia on admission and PP  Discharge Information: Date: 11/30/2011 PPD#2 Activity: pelvic rest Diet: routine Medications: PNV, Ibuprofen and Colace Condition: stable Instructions: refer to practice specific booklet Discharge to: home Follow-up Information    Follow up with [facility]. Schedule an appointment as soon as possible for a visit in 6 weeks. (or call as needed with any questions or concerns)          Newborn Data: Live born female "Reheag"  (delivery provider:  Lavera Guise, CNM) Birth Weight: 7 lb 5.3 oz (3325 g) APGAR: 8, 8  Home with mother.  Harlem Bula H 11/30/2011, 10:09 AM

## 2011-11-30 NOTE — Progress Notes (Signed)
Post Partum Day 2 Subjective: no complaints, up ad lib, voiding, tolerating PO, + flatus and Breastfeeding.  Undecided on birth control.  Elderly female at bedside.  Newborn lying beside pt in bed w/ her.  VB lighter.    Objective: Blood pressure 108/71, pulse 82, temperature 98.6 F (37 C), temperature source Oral, resp. rate 18, height 5' 5.5" (1.664 m), weight 100.699 kg (222 lb), SpO2 99.00%, unknown if currently breastfeeding.  Physical Exam:  General: alert, cooperative and no distress Lochia: appropriate Uterine Fundus: firm, below umbilicus;  Incision: n/a DVT Evaluation: No evidence of DVT seen on physical exam. Negative Homan's sign. No significant calf/ankle edema. .. Results for orders placed during the hospital encounter of 11/28/11 (from the past 72 hour(s))  HIV ANTIBODY (ROUTINE TESTING)     Status: Normal      Component Value Range Comment   HIV Non-reactive   rapid HIV  CBC     Status: Abnormal   Collection Time   11/28/11  2:50 AM      Component Value Range Comment   WBC 7.2  4.0 - 10.5 (K/uL)    RBC 4.90  3.87 - 5.11 (MIL/uL)    Hemoglobin 13.9  12.0 - 15.0 (g/dL)    HCT 62.9  52.8 - 41.3 (%)    MCV 84.1  78.0 - 100.0 (fL)    MCH 28.4  26.0 - 34.0 (pg)    MCHC 33.7  30.0 - 36.0 (g/dL)    RDW 24.4  01.0 - 27.2 (%)    Platelets 126 (*) 150 - 400 (K/uL)   RPR     Status: Normal   Collection Time   11/28/11  2:50 AM      Component Value Range Comment   RPR NON REACTIVE  NON REACTIVE    COMPREHENSIVE METABOLIC PANEL     Status: Abnormal   Collection Time   11/28/11  2:50 AM      Component Value Range Comment   Sodium 136  135 - 145 (mEq/L)    Potassium 3.8  3.5 - 5.1 (mEq/L)    Chloride 102  96 - 112 (mEq/L)    CO2 22  19 - 32 (mEq/L)    Glucose, Bld 86  70 - 99 (mg/dL)    BUN 4 (*) 6 - 23 (mg/dL)    Creatinine, Ser 5.36 (*) 0.50 - 1.10 (mg/dL)    Calcium 9.4  8.4 - 10.5 (mg/dL)    Total Protein 6.1  6.0 - 8.3 (g/dL)    Albumin 2.7 (*) 3.5 - 5.2 (g/dL)     AST 16  0 - 37 (U/L)    ALT 12  0 - 35 (U/L)    Alkaline Phosphatase 207 (*) 39 - 117 (U/L)    Total Bilirubin 0.2 (*) 0.3 - 1.2 (mg/dL)    GFR calc non Af Amer >90  >90 (mL/min)    GFR calc Af Amer >90  >90 (mL/min)   RAPID HIV SCREEN [facility])     Status: Normal   Collection Time   11/28/11  2:50 AM      Component Value Range Comment   SUDS Rapid HIV Screen NON REACTIVE  NON REACTIVE    CBC     Status: Abnormal   Collection Time   11/28/11  6:56 AM      Component Value Range Comment   WBC 9.3  4.0 - 10.5 (K/uL)    RBC 4.68  3.87 - 5.11 (MIL/uL)  Hemoglobin 13.0  12.0 - 15.0 (g/dL)    HCT 16.1  09.6 - 04.5 (%)    MCV 84.2  78.0 - 100.0 (fL)    MCH 27.8  26.0 - 34.0 (pg)    MCHC 33.0  30.0 - 36.0 (g/dL)    RDW 40.9  81.1 - 91.4 (%)    Platelets 129 (*) 150 - 400 (K/uL)   CBC     Status: Abnormal   Collection Time   11/29/11  5:46 AM      Component Value Range Comment   WBC 8.1  4.0 - 10.5 (K/uL)    RBC 4.42  3.87 - 5.11 (MIL/uL)    Hemoglobin 12.5  12.0 - 15.0 (g/dL)    HCT 78.2  95.6 - 21.3 (%)    MCV 85.1  78.0 - 100.0 (fL)    MCH 28.3  26.0 - 34.0 (pg)    MCHC 33.2  30.0 - 36.0 (g/dL)    RDW 08.6  57.8 - 46.9 (%)    Platelets 138 (*) 150 - 400 (K/uL)   GLUCOSE, CAPILLARY     Status: Abnormal   Collection Time   11/29/11  8:24 PM      Component Value Range Comment   Glucose-Capillary 147 (*) 70 - 99 (mg/dL)    Comment 1 Notify RN     GLUCOSE, CAPILLARY     Status: Normal   Collection Time   11/30/11  6:03 AM      Component Value Range Comment   Glucose-Capillary 84  70 - 99 (mg/dL)    Comment 1 Notify RN         Assessment/Plan: Discharge home and Breastfeeding; Thrombocytopenia; GDM-diet controlled; Elaine Kelly. F/u 6 weeks at [facility], or prn; rec'd diligence w/ CHO modified diet.  Motrin Rx given; cont PNV.   Abstinence until 6 week visit.  LOS: 2 days   Elaine Kelly H 11/30/2011, 10:00 AM

## 2011-12-05 ENCOUNTER — Inpatient Hospital Stay (HOSPITAL_COMMUNITY)
Admission: AD | Admit: 2011-12-05 | Discharge: 2011-12-05 | Disposition: A | Discharge status: Home / Self Care | Payer: Medicaid Other | Source: Ambulatory Visit | Attending: Obstetrics and Gynecology | Admitting: Obstetrics and Gynecology

## 2011-12-05 ENCOUNTER — Encounter (HOSPITAL_COMMUNITY): Payer: Self-pay | Admitting: *Deleted

## 2011-12-05 DIAGNOSIS — O135 Gestational [pregnancy-induced] hypertension without significant proteinuria, complicating the puerperium: Secondary | ICD-10-CM | POA: Insufficient documentation

## 2011-12-05 LAB — URINALYSIS, ROUTINE W REFLEX MICROSCOPIC
Specific Gravity, Urine: 1.01 (ref 1.005–1.030)
Urobilinogen, UA: 0.2 mg/dL (ref 0.0–1.0)

## 2011-12-05 LAB — COMPREHENSIVE METABOLIC PANEL
ALT: 19 U/L (ref 0–35)
Alkaline Phosphatase: 127 U/L — ABNORMAL HIGH (ref 39–117)
BUN: 9 mg/dL (ref 6–23)
CO2: 27 mEq/L (ref 19–32)
Calcium: 8.8 mg/dL (ref 8.4–10.5)
GFR calc Af Amer: >90 mL/min (ref >90)
GFR calc non Af Amer: >90 mL/min (ref >90)
Glucose, Bld: 123 mg/dL — ABNORMAL HIGH (ref 70–99)
Potassium: 3.6 mEq/L (ref 3.5–5.1)
Sodium: 138 mEq/L (ref 135–145)

## 2011-12-05 LAB — URINE MICROSCOPIC-ADD ON

## 2011-12-05 LAB — CBC
MCH: 28 pg (ref 26.0–34.0)
MCHC: 33.2 g/dL (ref 30.0–36.0)
Platelets: 223 10*3/uL (ref 150–400)
RDW: 13.9 % (ref 11.5–15.5)

## 2011-12-05 LAB — URIC ACID: Uric Acid, Serum: 5 mg/dL (ref 2.4–7.0)

## 2011-12-05 LAB — DIFFERENTIAL
Eosinophils Relative: 2 % (ref 0–5)
Neutro Abs: 3.1 10*3/uL (ref 1.7–7.7)
Neutrophils Relative %: 46 % (ref 43–77)

## 2011-12-05 NOTE — Progress Notes (Signed)
History   Presents to [facility] with c/o of high bp with Smart [employer] RN visit with visual spots and blurring, had a headache and it went away has not been having headaches, denies abd pain or swelling. Breast feeding baby q 1 hour. Chief Complaint  Patient presents with  . Hypertension   SVD 1/26 pg complicated by GDM diet control  OB History    Grav Para Term Preterm Abortions TAB SAB Ect Mult Living   5 3 3  0 2 0 2 0 0 3      Past Medical History  Diagnosis Date  . Diabetes mellitus   . Gestational diabetes     Past Surgical History  Procedure Date  . No past surgeries     Family History  Problem Relation Age of Onset  . Hypertension Father     History  Substance Use Topics  . Smoking status: Never Smoker   . Smokeless tobacco: Never Used  . Alcohol Use: No    Allergies: No Known Allergies  Prescriptions prior to admission  Medication Sig Dispense Refill  . ibuprofen (ADVIL,MOTRIN) 600 MG tablet Take 1 tablet (600 mg total) by mouth every 6 (six) hours.  30 tablet  2    @ROS @ Physical Exam  Calm, cooperative, asleep in bed. Lungs clear bilaterally, AP regular, abd soft, rounded NT, bowel sounds active, FF 14 week size scant serosa flow, DTRs +1 bilaterally no clonus, trace edema lower legs Blood pressure 116/73, pulse 92, temperature 98.6 F (37 C), temperature source Oral, resp. rate 18, height 5' 5.5" (1.664 m), weight 210 lb (95.255 kg), unknown if currently breastfeeding.  Results for orders placed during the hospital encounter of 12/05/11 (from the past 24 hour(s))  URINALYSIS, ROUTINE W REFLEX MICROSCOPIC     Status: Abnormal   Collection Time   12/05/11  4:10 PM      Component Value Range   Color, Urine AMBER (*) YELLOW    APPearance CLEAR  CLEAR    Specific Gravity, Urine 1.010  1.005 - 1.030    pH 7.0  5.0 - 8.0    Glucose, UA NEGATIVE  NEGATIVE (mg/dL)   Hgb urine dipstick LARGE (*) NEGATIVE    Bilirubin Urine NEGATIVE  NEGATIVE    Ketones, ur  NEGATIVE  NEGATIVE (mg/dL)   Protein, ur NEGATIVE  NEGATIVE (mg/dL)   Urobilinogen, UA 0.2  0.0 - 1.0 (mg/dL)   Nitrite NEGATIVE  NEGATIVE    Leukocytes, UA SMALL (*) NEGATIVE   URINE MICROSCOPIC-ADD ON     Status: Abnormal   Collection Time   12/05/11  4:10 PM      Component Value Range   Squamous Epithelial / LPF FEW (*) RARE    WBC, UA 11-20  <3 (WBC/hpf)   RBC / HPF 3-6  <3 (RBC/hpf)   Bacteria, UA FEW (*) RARE   COMPREHENSIVE METABOLIC PANEL     Status: Abnormal   Collection Time   12/05/11  4:24 PM      Component Value Range   Sodium 138  135 - 145 (mEq/L)   Potassium 3.6  3.5 - 5.1 (mEq/L)   Chloride 103  96 - 112 (mEq/L)   CO2 27  19 - 32 (mEq/L)   Glucose, Bld 123 (*) 70 - 99 (mg/dL)   BUN 9  6 - 23 (mg/dL)   Creatinine, Ser 1.47  0.50 - 1.10 (mg/dL)   Calcium 8.8  8.4 - 82.9 (mg/dL)   Total Protein 5.9 (*) 6.0 -  8.3 (g/dL)   Albumin 2.5 (*) 3.5 - 5.2 (g/dL)   AST 14  0 - 37 (U/L)   ALT 19  0 - 35 (U/L)   Alkaline Phosphatase 127 (*) 39 - 117 (U/L)   Total Bilirubin 0.1 (*) 0.3 - 1.2 (mg/dL)   GFR calc non Af Amer >90  >90 (mL/min)   GFR calc Af Amer >90  >90 (mL/min)  URIC ACID     Status: Normal   Collection Time   12/05/11  4:24 PM      Component Value Range   Uric Acid, Serum 5.0  2.4 - 7.0 (mg/dL)  CBC     Status: Normal   Collection Time   12/05/11  4:24 PM      Component Value Range   WBC 6.7  4.0 - 10.5 (K/uL)   RBC 4.72  3.87 - 5.11 (MIL/uL)   Hemoglobin 13.2  12.0 - 15.0 (g/dL)   HCT 40.9  81.1 - 91.4 (%)   MCV 84.3  78.0 - 100.0 (fL)   MCH 28.0  26.0 - 34.0 (pg)   MCHC 33.2  30.0 - 36.0 (g/dL)   RDW 78.2  95.6 - 21.3 (%)   Platelets 223  150 - 400 (K/uL)  DIFFERENTIAL     Status: Normal   Collection Time   12/05/11  4:24 PM      Component Value Range   Neutrophils Relative 46  43 - 77 (%)   Neutro Abs 3.1  1.7 - 7.7 (K/uL)   Lymphocytes Relative 45  12 - 46 (%)   Lymphs Abs 3.0  0.7 - 4.0 (K/uL)   Monocytes Relative 7  3 - 12 (%)   Monocytes  Absolute 0.5  0.1 - 1.0 (K/uL)   Eosinophils Relative 2  0 - 5 (%)   Eosinophils Absolute 0.1  0.0 - 0.7 (K/uL)   Basophils Relative 0  0 - 1 (%)   Basophils Absolute 0.0  0.0 - 0.1 (K/uL)  LACTATE DEHYDROGENASE     Status: Normal   Collection Time   12/05/11  4:24 PM      Component Value Range   LD 180  94 - 250 (U/L)   ED Course  BP, labs reviewed with Dr. Su Hilt per telephone, plan f/o office Monday for bp check and bring in 24 hour urine protein and creatinine clearance. S/S [facility] to report reviewed, home limit activity.Lavera Guise, CNM

## 2011-12-05 NOTE — Progress Notes (Addendum)
Delivered 01/25, vaginal delivery.  Smart start nurse out, elevated bp, denies hx of same.  No headache or epigastric pain.  does have blurring and spots, reports increased swelling. Baby is doing well, is breast and bottle feeding. Milk is in, states latches on well.  bleeding is getting lighter.  Some back pain at times, ibuprofen helps, feels tired and weak.  Older children excited about baby.

## 2012-01-20 ENCOUNTER — Ambulatory Visit (INDEPENDENT_AMBULATORY_CARE_PROVIDER_SITE_OTHER): Payer: Medicaid Other | Admitting: Obstetrics and Gynecology

## 2012-01-20 DIAGNOSIS — Z3040 Encounter for surveillance of contraceptives, unspecified: Secondary | ICD-10-CM

## 2012-01-28 ENCOUNTER — Encounter (INDEPENDENT_AMBULATORY_CARE_PROVIDER_SITE_OTHER): Payer: Medicaid Other | Admitting: Obstetrics and Gynecology

## 2012-01-28 DIAGNOSIS — Z3049 Encounter for surveillance of other contraceptives: Secondary | ICD-10-CM

## 2012-01-28 DIAGNOSIS — Z30430 Encounter for insertion of intrauterine contraceptive device: Secondary | ICD-10-CM

## 2012-03-01 ENCOUNTER — Encounter: Payer: Medicaid Other | Admitting: Obstetrics and Gynecology

## 2012-03-09 ENCOUNTER — Encounter: Payer: Medicaid Other | Admitting: Obstetrics and Gynecology

## 2012-03-26 ENCOUNTER — Encounter: Payer: Self-pay | Admitting: Obstetrics and Gynecology

## 2013-07-05 LAB — OB RESULTS CONSOLE ABO/RH: RH Type: POSITIVE

## 2013-07-05 LAB — OB RESULTS CONSOLE RPR: RPR: NONREACTIVE

## 2013-07-05 LAB — OB RESULTS CONSOLE GC/CHLAMYDIA
Chlamydia: NEGATIVE
Gonorrhea: NEGATIVE

## 2013-07-05 LAB — OB RESULTS CONSOLE HEPATITIS B SURFACE ANTIGEN: HEP B S AG: NEGATIVE

## 2013-07-05 LAB — OB RESULTS CONSOLE ANTIBODY SCREEN: ANTIBODY SCREEN: NEGATIVE

## 2013-07-05 LAB — OB RESULTS CONSOLE RUBELLA ANTIBODY, IGM: RUBELLA: IMMUNE

## 2013-07-05 LAB — OB RESULTS CONSOLE HIV ANTIBODY (ROUTINE TESTING): HIV: NONREACTIVE

## 2013-11-03 NOTE — L&D Delivery Note (Signed)
Delivery Note  SROM, clear fluid, just prior to delivery, pt involuntarily pushing,   At 9:30 PM a viable female was delivered via Vaginal, Spontaneous Delivery (Presentation: OA;  ).  APGAR: 8, 9; weight 7 lb 3.2 oz (3266 g).   Placenta status: Intact, Spontaneous Pathology.  Cord: 3 vessels with the following complications: None.  Cord pH: n/a  Anesthesia: Epidural , lidocaine  Episiotomy: None Lacerations: 2nd degree Suture Repair: 3.0 vicryl  Est. Blood Loss (mL): 300  Mom to postpartum.  Baby to Couplet care / Skin to Skin. Pt plans to BF Mom and baby in stable condition Will check FBS in the AM Otherwise routine PP orders   Zaylee Cornia M 12/31/2013, 10:08 PM

## 2013-11-09 ENCOUNTER — Encounter: Payer: Medicaid Other | Attending: Family Medicine | Admitting: *Deleted

## 2013-11-09 DIAGNOSIS — O24410 Gestational diabetes mellitus in pregnancy, diet controlled: Secondary | ICD-10-CM

## 2013-11-09 DIAGNOSIS — O99810 Abnormal glucose complicating pregnancy: Secondary | ICD-10-CM | POA: Insufficient documentation

## 2013-11-09 DIAGNOSIS — Z713 Dietary counseling and surveillance: Secondary | ICD-10-CM | POA: Insufficient documentation

## 2013-11-09 NOTE — Progress Notes (Signed)
  Patient was seen on 11/09/13 for Gestational Diabetes self-management at the [facility]. She presents with an interpreter due to language barrier.  This is her fourth child. She had GDM with her last pregnancy as well. The following learning objectives were met by the patient during this course:   States the definition of Gestational Diabetes  States why dietary management is important in controlling blood glucose  Describes the effects of carbohydrates on blood glucose levels  Demonstrates ability to create a balanced meal plan  Demonstrates carbohydrate counting   States when to check blood glucose levels  Demonstrates proper blood glucose monitoring techniques  States the effect of stress and exercise on blood glucose levels  States the importance of limiting caffeine and abstaining from alcohol and smoking  Plan:  Aim for 2 Carb Choices per meal (30 grams) +/- 1 either way for breakfast Aim for 3 Carb Choices per meal (45 grams) +/- 1 either way from lunch and dinner Aim for 1-2 Carbs per snack Begin reading food labels for Total Carbohydrate and sugar grams of foods Consider  increasing your activity level by walking daily as tolerated Begin checking BG before breakfast and 1-2 hours after first bit of breakfast, lunch and dinner after  as directed by MD  Take medication  as directed by MD  Blood glucose monitor given: Accu-Chek Nano Lot # [id number]  Exp:11/02/14  Blood glucose reading: 136  Patient instructed to monitor glucose levels: FBS: 60 - <90 1 hour: <140 2 hour: <120  Patient received the following handouts:  Nutrition Diabetes and Pregnancy  Carbohydrate Counting List  Meal Planning worksheet  Patient will be seen for follow-up as needed.

## 2013-11-18 ENCOUNTER — Emergency Department (HOSPITAL_BASED_OUTPATIENT_CLINIC_OR_DEPARTMENT_OTHER)
Admission: EM | Admit: 2013-11-18 | Discharge: 2013-11-18 | Disposition: A | Discharge status: Home / Self Care | Payer: Medicaid Other | Attending: Emergency Medicine | Admitting: Emergency Medicine

## 2013-11-18 ENCOUNTER — Encounter (HOSPITAL_BASED_OUTPATIENT_CLINIC_OR_DEPARTMENT_OTHER): Payer: Self-pay | Admitting: Emergency Medicine

## 2013-11-18 DIAGNOSIS — Z79899 Other long term (current) drug therapy: Secondary | ICD-10-CM | POA: Insufficient documentation

## 2013-11-18 DIAGNOSIS — O24919 Unspecified diabetes mellitus in pregnancy, unspecified trimester: Secondary | ICD-10-CM | POA: Insufficient documentation

## 2013-11-18 DIAGNOSIS — J40 Bronchitis, not specified as acute or chronic: Secondary | ICD-10-CM

## 2013-11-18 DIAGNOSIS — E119 Type 2 diabetes mellitus without complications: Secondary | ICD-10-CM | POA: Insufficient documentation

## 2013-11-18 DIAGNOSIS — J209 Acute bronchitis, unspecified: Secondary | ICD-10-CM | POA: Insufficient documentation

## 2013-11-18 DIAGNOSIS — O9989 Other specified diseases and conditions complicating pregnancy, childbirth and the puerperium: Secondary | ICD-10-CM | POA: Insufficient documentation

## 2013-11-18 DIAGNOSIS — R05 Cough: Secondary | ICD-10-CM

## 2013-11-18 DIAGNOSIS — R059 Cough, unspecified: Secondary | ICD-10-CM

## 2013-11-18 LAB — RAPID STREP SCREEN (MED CTR MEBANE ONLY): Streptococcus, Group A Screen (Direct): NEGATIVE

## 2013-11-18 MED ORDER — BENZONATATE 100 MG PO CAPS: 100.0000 mg | ORAL_CAPSULE | Freq: Three times a day (TID) | ORAL | Status: DC | PRN

## 2013-11-18 MED ORDER — AZITHROMYCIN 250 MG PO TABS: ORAL_TABLET | ORAL | Status: DC

## 2013-11-18 NOTE — ED Provider Notes (Signed)
CSN: [id number]     Arrival date & time 11/18/13  1820 History   This chart was scribed for Elaine Shi, MD by Blanchard Kelch, ED Scribe. The patient was seen in room MH10/MH10. Patient's care was started at 8:51 PM.     Chief Complaint  Patient presents with  . Influenza    Patient is a 28 y.o. female presenting with flu symptoms. The history is provided by the patient. No language interpreter was used.  Influenza   HPI Comments: Elaine Kelly is a 28 y.o. female who presents to the Emergency Department complaining of a constant, moderate, worsening sore throat that began five days ago. The pain is worsened by drinking, but she is able to. She has associated rhinorrhea, intermittent productive cough with green sputum for a few months and subjective fever that began today. She states the cough and other symptoms are keeping her awake at night. She denies any vomiting or diarrhea. Her husband denies he has been sick recently. She has a past medical history of diabetes. She reports her blood sugar level at home was 100 before coming in. She denies taking any medication for the diabetes and controls it with diet. She is currently [redacted] weeks pregnant. This is her fourth pregnancy. She denies smoking.   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 She states she has an appointment with her OB-GYN on Monday, 1/19.  Past Medical History  Diagnosis Date  . Diabetes mellitus   . Gestational diabetes    Past Surgical History  Procedure Laterality Date  . No past surgeries     Family History  Problem Relation Age of Onset  . Hypertension Father    History  Substance Use Topics  . Smoking status: Never Smoker   . Smokeless tobacco: Never Used  . Alcohol Use: No   OB History   Grav Para Term Preterm Abortions TAB SAB Ect Mult Living   6 3 3  0 2 0 2 0 0 3     Review of Systems A complete 10 system review of systems was obtained and all systems  are negative except as noted in the HPI and PMH.    Allergies  Review of patient's allergies indicates no known allergies.  Home Medications   Current Outpatient Rx  Name  Route  Sig  Dispense  Refill  . Prenatal Multivit-Min-Fe-FA (PRENATAL VITAMINS) 0.8 MG tablet   Oral   Take 1 tablet by mouth daily.         Marland Kitchen azithromycin (ZITHROMAX Z-PAK) 250 MG tablet      Take 2 capsules on day one then one capsule a day until gone   6 tablet   0   . benzonatate (TESSALON) 100 MG capsule   Oral   Take 1 capsule (100 mg total) by mouth 3 (three) times daily as needed for cough. Please contact your obstetrician prior to filling this prescription   21 capsule   0    Triage Vitals: BP 139/79  Pulse 100  Temp(Src) 98.8 F (37.1 C) (Oral)  Resp 20  Wt 230 lb (104.327 kg)  SpO2 100%  LMP 04/01/2013  Physical Exam  Nursing note and vitals reviewed. Constitutional: She is oriented to person, place, and time. She appears well-developed and well-nourished. No distress.  HENT:  Head: Normocephalic and atraumatic.  Eyes: Pupils are equal, round, and reactive to light.  Neck: Normal range of motion.  Cardiovascular:  Normal rate and intact distal pulses.   Pulmonary/Chest: No respiratory distress. She has no wheezes. She has no rales.  Abdominal: Normal appearance. She exhibits no distension.  Musculoskeletal: Normal range of motion.  Neurological: She is alert and oriented to person, place, and time. No cranial nerve deficit.  Skin: Skin is warm and dry. No rash noted.  Psychiatric: She has a normal mood and affect. Her behavior is normal.    ED Course  Procedures (including critical care time)  DIAGNOSTIC STUDIES: Oxygen Saturation is 100% on room air, normal by my interpretation.    COORDINATION OF CARE: 8:56 PM -Will prescribe antibiotic for URI. Patient verbalizes understanding and agrees with treatment plan.    Labs Review Labs Reviewed  RAPID STREP SCREEN  CULTURE,  GROUP A STREP   Imaging Review No results found.    MDM   1. Bronchitis   2. Cough      I personally performed the services described in this documentation, which was scribed in my presence. The recorded information has been reviewed and considered.    Elaine Shiobert L Caroleena Paolini, MD 11/18/13 2109

## 2013-11-18 NOTE — Discharge Instructions (Signed)
Bronchitis °Bronchitis is swelling (inflammation) of the air tubes leading to your lungs (bronchi). This causes mucus and a cough. If the swelling gets bad, you may have trouble breathing. °HOME CARE  °· Rest. °· Drink enough fluids to keep your pee (urine) clear or pale yellow (unless you have a condition where you have to watch how much you drink). °· Only take medicine as told by your doctor. If you were given antibiotic medicines, finish them even if you start to feel better. °· Avoid smoke, irritating chemicals, and strong smells. These make the problem worse. Quit smoking if you smoke. This helps your lungs heal faster. °· Use a cool mist humidifier. Change the water in the humidifier every day. You can also sit in the bathroom with hot shower running for 5 10 minutes. Keep the door closed. °· See your health care provider as told. °· Wash your hands often. °GET HELP IF: °Your problems do not get better after 1 week. °GET HELP RIGHT AWAY IF:  °· Your fever gets worse. °· You have chills. °· Your chest hurts. °· Your problems breathing get worse. °· You have blood in your mucus. °· You pass out (faint). °· You feel lightheaded. °· You have a bad headache. °· You throw up (vomit) again and again. °MAKE SURE YOU: °· Understand these instructions. °· Will watch your condition. °· Will get help right away if you are not doing well or get worse. °Document Released: 04/07/2008 Document Revised: 08/10/2013 Document Reviewed: 06/14/2013 °[employer]® Patient Information ©2014 [employer]. ° °

## 2013-11-18 NOTE — ED Notes (Addendum)
Fever, sore throat, and runny nose yesterday. She has had a cough x 2 months. She is [redacted] weeks pregnant with no complications or complaints related to pregnancy today.

## 2013-11-20 LAB — CULTURE, GROUP A STREP

## 2013-12-06 LAB — OB RESULTS CONSOLE GBS: GBS: POSITIVE

## 2013-12-22 ENCOUNTER — Telehealth (HOSPITAL_COMMUNITY): Payer: Self-pay | Admitting: *Deleted

## 2013-12-22 ENCOUNTER — Encounter (HOSPITAL_COMMUNITY): Payer: Self-pay | Admitting: *Deleted

## 2013-12-22 NOTE — Telephone Encounter (Signed)
Preadmission screen Interpreter [phone]

## 2013-12-23 ENCOUNTER — Observation Stay (HOSPITAL_COMMUNITY)
Admission: RE | Admit: 2013-12-23 | Discharge: 2013-12-23 | Disposition: A | Discharge status: Home / Self Care | Payer: Medicaid Other | Source: Ambulatory Visit | Attending: Obstetrics and Gynecology | Admitting: Obstetrics and Gynecology

## 2013-12-23 ENCOUNTER — Encounter (HOSPITAL_COMMUNITY): Payer: Self-pay

## 2013-12-23 DIAGNOSIS — O99891 Other specified diseases and conditions complicating pregnancy: Principal | ICD-10-CM | POA: Insufficient documentation

## 2013-12-23 DIAGNOSIS — O9989 Other specified diseases and conditions complicating pregnancy, childbirth and the puerperium: Principal | ICD-10-CM

## 2013-12-23 NOTE — Discharge Instructions (Signed)
Fetal Movement Counts Patient Name: __________________________________________________ Patient Due Date: ____________________ Performing a fetal movement count is highly recommended in high-risk pregnancies, but it is good for every pregnant woman to do. Your caregiver may ask you to start counting fetal movements at 28 weeks of the pregnancy. Fetal movements often increase:  After eating a full meal.  After physical activity.  After eating or drinking something sweet or cold.  At rest. Pay attention to when you feel the baby is most active. This will help you notice a pattern of your baby's sleep and wake cycles and what factors contribute to an increase in fetal movement. It is important to perform a fetal movement count at the same time each day when your baby is normally most active.  HOW TO COUNT FETAL MOVEMENTS 1. Find a quiet and comfortable area to sit or lie down on your left side. Lying on your left side provides the best blood and oxygen circulation to your baby. 2. Write down the day and time on a sheet of paper or in a journal. 3. Start counting kicks, flutters, swishes, rolls, or jabs in a 2 hour period. You should feel at least 10 movements within 2 hours. 4. If you do not feel 10 movements in 2 hours, wait 2 3 hours and count again. Look for a change in the pattern or not enough counts in 2 hours. SEEK MEDICAL CARE IF:  You feel less than 10 counts in 2 hours, tried twice.  There is no movement in over an hour.  The pattern is changing or taking longer each day to reach 10 counts in 2 hours.  You feel the baby is not moving as he or she usually does. Date: ____________ Movements: ____________ Start time: ____________ Finish time: ____________  Date: ____________ Movements: ____________ Start time: ____________ Finish time: ____________ Date: ____________ Movements: ____________ Start time: ____________ Finish time: ____________ Date: ____________ Movements: ____________  Start time: ____________ Finish time: ____________ Date: ____________ Movements: ____________ Start time: ____________ Finish time: ____________ Date: ____________ Movements: ____________ Start time: ____________ Finish time: ____________ Date: ____________ Movements: ____________ Start time: ____________ Finish time: ____________ Date: ____________ Movements: ____________ Start time: ____________ Finish time: ____________  Date: ____________ Movements: ____________ Start time: ____________ Finish time: ____________ Date: ____________ Movements: ____________ Start time: ____________ Finish time: ____________ Date: ____________ Movements: ____________ Start time: ____________ Finish time: ____________ Date: ____________ Movements: ____________ Start time: ____________ Finish time: ____________ Date: ____________ Movements: ____________ Start time: ____________ Finish time: ____________ Date: ____________ Movements: ____________ Start time: ____________ Finish time: ____________ Date: ____________ Movements: ____________ Start time: ____________ Finish time: ____________  Date: ____________ Movements: ____________ Start time: ____________ Finish time: ____________ Date: ____________ Movements: ____________ Start time: ____________ Finish time: ____________ Date: ____________ Movements: ____________ Start time: ____________ Finish time: ____________ Date: ____________ Movements: ____________ Start time: ____________ Finish time: ____________ Date: ____________ Movements: ____________ Start time: ____________ Finish time: ____________ Date: ____________ Movements: ____________ Start time: ____________ Finish time: ____________ Date: ____________ Movements: ____________ Start time: ____________ Finish time: ____________  Date: ____________ Movements: ____________ Start time: ____________ Finish time: ____________ Date: ____________ Movements: ____________ Start time: ____________ Finish time:  ____________ Date: ____________ Movements: ____________ Start time: ____________ Finish time: ____________ Date: ____________ Movements: ____________ Start time: ____________ Finish time: ____________ Date: ____________ Movements: ____________ Start time: ____________ Finish time: ____________ Date: ____________ Movements: ____________ Start time: ____________ Finish time: ____________ Date: ____________ Movements: ____________ Start time: ____________ Finish time: ____________  Date: ____________ Movements: ____________ Start time: ____________ Finish   time: ____________ Date: ____________ Movements: ____________ Start time: ____________ Finish time: ____________ Date: ____________ Movements: ____________ Start time: ____________ Finish time: ____________ Date: ____________ Movements: ____________ Start time: ____________ Finish time: ____________ Date: ____________ Movements: ____________ Start time: ____________ Finish time: ____________ Date: ____________ Movements: ____________ Start time: ____________ Finish time: ____________ Date: ____________ Movements: ____________ Start time: ____________ Finish time: ____________  Date: ____________ Movements: ____________ Start time: ____________ Finish time: ____________ Date: ____________ Movements: ____________ Start time: ____________ Finish time: ____________ Date: ____________ Movements: ____________ Start time: ____________ Finish time: ____________ Date: ____________ Movements: ____________ Start time: ____________ Finish time: ____________ Date: ____________ Movements: ____________ Start time: ____________ Finish time: ____________ Date: ____________ Movements: ____________ Start time: ____________ Finish time: ____________ Date: ____________ Movements: ____________ Start time: ____________ Finish time: ____________  Date: ____________ Movements: ____________ Start time: ____________ Finish time: ____________ Date: ____________ Movements:  ____________ Start time: ____________ Finish time: ____________ Date: ____________ Movements: ____________ Start time: ____________ Finish time: ____________ Date: ____________ Movements: ____________ Start time: ____________ Finish time: ____________ Date: ____________ Movements: ____________ Start time: ____________ Finish time: ____________ Date: ____________ Movements: ____________ Start time: ____________ Finish time: ____________ Date: ____________ Movements: ____________ Start time: ____________ Finish time: ____________  Date: ____________ Movements: ____________ Start time: ____________ Finish time: ____________ Date: ____________ Movements: ____________ Start time: ____________ Finish time: ____________ Date: ____________ Movements: ____________ Start time: ____________ Finish time: ____________ Date: ____________ Movements: ____________ Start time: ____________ Finish time: ____________ Date: ____________ Movements: ____________ Start time: ____________ Finish time: ____________ Date: ____________ Movements: ____________ Start time: ____________ Finish time: ____________ Document Released: 11/19/2006 Document Revised: 10/06/2012 Document Reviewed: 08/16/2012 [employer] Patient Information 2014 [employer].  

## 2013-12-25 NOTE — Progress Notes (Signed)
   Pt was seen about 8am, scheduled today for external cephalic version  Per RN, US confirms Vtx  Leopolds also confirms vtx  Pt denies any ctx, VB or LOF, GFM  FHR cat 1 toco - quiet  Pt to f/u [facility] routine rv'd FKC and labor sx's  S.Shellsea Borunda, CNM  12/23/13 @8am  (late entry)

## 2013-12-31 ENCOUNTER — Encounter (HOSPITAL_COMMUNITY): Payer: Medicaid Other | Admitting: Anesthesiology

## 2013-12-31 ENCOUNTER — Inpatient Hospital Stay (HOSPITAL_COMMUNITY)
Admission: AD | Admit: 2013-12-31 | Discharge: 2014-01-02 | DRG: 774 | Disposition: A | Discharge status: Home / Self Care | Payer: Medicaid Other | Source: Ambulatory Visit | Attending: Obstetrics and Gynecology | Admitting: Obstetrics and Gynecology

## 2013-12-31 ENCOUNTER — Encounter (HOSPITAL_COMMUNITY): Payer: Self-pay | Admitting: *Deleted

## 2013-12-31 ENCOUNTER — Inpatient Hospital Stay (HOSPITAL_COMMUNITY): Payer: Medicaid Other | Admitting: Anesthesiology

## 2013-12-31 DIAGNOSIS — O9989 Other specified diseases and conditions complicating pregnancy, childbirth and the puerperium: Secondary | ICD-10-CM

## 2013-12-31 DIAGNOSIS — IMO0001 Reserved for inherently not codable concepts without codable children: Secondary | ICD-10-CM

## 2013-12-31 DIAGNOSIS — O99814 Abnormal glucose complicating childbirth: Secondary | ICD-10-CM | POA: Diagnosis present

## 2013-12-31 DIAGNOSIS — E669 Obesity, unspecified: Secondary | ICD-10-CM | POA: Diagnosis present

## 2013-12-31 DIAGNOSIS — B951 Streptococcus, group B, as the cause of diseases classified elsewhere: Secondary | ICD-10-CM | POA: Diagnosis present

## 2013-12-31 DIAGNOSIS — Z22330 Carrier of Group B streptococcus: Secondary | ICD-10-CM

## 2013-12-31 DIAGNOSIS — O99892 Other specified diseases and conditions complicating childbirth: Secondary | ICD-10-CM | POA: Diagnosis present

## 2013-12-31 DIAGNOSIS — O99214 Obesity complicating childbirth: Secondary | ICD-10-CM

## 2013-12-31 LAB — GLUCOSE, CAPILLARY: Glucose-Capillary: 112 mg/dL — ABNORMAL HIGH (ref 70–99)

## 2013-12-31 LAB — CBC
HCT: 38.4 % (ref 36.0–46.0)
Hemoglobin: 12.8 g/dL (ref 12.0–15.0)
MCH: 26.6 pg (ref 26.0–34.0)
MCHC: 33.3 g/dL (ref 30.0–36.0)
MCV: 79.8 fL (ref 78.0–100.0)
PLATELETS: 142 10*3/uL — AB (ref 150–400)
RBC: 4.81 MIL/uL (ref 3.87–5.11)
RDW: 15.1 % (ref 11.5–15.5)
WBC: 8.5 10*3/uL (ref 4.0–10.5)

## 2013-12-31 MED ORDER — IBUPROFEN 600 MG PO TABS: 600.0000 mg | ORAL_TABLET | Freq: Four times a day (QID) | ORAL | Status: DC | PRN

## 2013-12-31 MED ORDER — FENTANYL 2.5 MCG/ML BUPIVACAINE 1/10 % EPIDURAL INFUSION (WH - ANES)
INTRAMUSCULAR | Status: DC | PRN
  Administered 2013-12-31: 14 mL/h via EPIDURAL

## 2013-12-31 MED ORDER — ACETAMINOPHEN 325 MG PO TABS: 650.0000 mg | ORAL_TABLET | ORAL | Status: DC | PRN

## 2013-12-31 MED ORDER — EPHEDRINE 5 MG/ML INJ
10.0000 mg | INTRAVENOUS | Status: DC | PRN
  Filled 2013-12-31: qty 2

## 2013-12-31 MED ORDER — OXYTOCIN BOLUS FROM INFUSION: 500.0000 mL | INTRAVENOUS | Status: DC

## 2013-12-31 MED ORDER — SODIUM CHLORIDE 0.9 % IV SOLN
2.0000 g | Freq: Once | INTRAVENOUS | Status: AC
  Administered 2013-12-31: 2 g via INTRAVENOUS
  Filled 2013-12-31: qty 2000

## 2013-12-31 MED ORDER — LACTATED RINGERS IV SOLN: 500.0000 mL | INTRAVENOUS | Status: DC | PRN

## 2013-12-31 MED ORDER — LIDOCAINE HCL (PF) 1 % IJ SOLN
30.0000 mL | INTRAMUSCULAR | Status: DC | PRN
  Administered 2013-12-31: 30 mL via SUBCUTANEOUS
  Filled 2013-12-31: qty 30

## 2013-12-31 MED ORDER — PHENYLEPHRINE 40 MCG/ML (10ML) SYRINGE FOR IV PUSH (FOR BLOOD PRESSURE SUPPORT)
80.0000 ug | PREFILLED_SYRINGE | INTRAVENOUS | Status: DC | PRN
  Filled 2013-12-31: qty 2
  Filled 2013-12-31: qty 10

## 2013-12-31 MED ORDER — ONDANSETRON HCL 4 MG/2ML IJ SOLN: 4.0000 mg | Freq: Four times a day (QID) | INTRAMUSCULAR | Status: DC | PRN

## 2013-12-31 MED ORDER — OXYCODONE-ACETAMINOPHEN 5-325 MG PO TABS
1.0000 | ORAL_TABLET | ORAL | Status: DC | PRN
Start: 2013-12-31 — End: 2014-01-01

## 2013-12-31 MED ORDER — LACTATED RINGERS IV SOLN
500.0000 mL | Freq: Once | INTRAVENOUS | Status: AC
  Administered 2013-12-31: 500 mL via INTRAVENOUS

## 2013-12-31 MED ORDER — LACTATED RINGERS IV SOLN
INTRAVENOUS | Status: DC
  Administered 2013-12-31: 19:00:00 via INTRAVENOUS

## 2013-12-31 MED ORDER — FLEET ENEMA 7-19 GM/118ML RE ENEM: 1.0000 | ENEMA | RECTAL | Status: DC | PRN

## 2013-12-31 MED ORDER — PHENYLEPHRINE 40 MCG/ML (10ML) SYRINGE FOR IV PUSH (FOR BLOOD PRESSURE SUPPORT)
80.0000 ug | PREFILLED_SYRINGE | INTRAVENOUS | Status: DC | PRN
  Filled 2013-12-31: qty 2

## 2013-12-31 MED ORDER — OXYTOCIN 40 UNITS IN LACTATED RINGERS INFUSION - SIMPLE MED
62.5000 mL/h | INTRAVENOUS | Status: DC
  Filled 2013-12-31: qty 1000

## 2013-12-31 MED ORDER — FENTANYL 2.5 MCG/ML BUPIVACAINE 1/10 % EPIDURAL INFUSION (WH - ANES): 14.0000 mL/h | INTRAMUSCULAR | Status: DC | PRN

## 2013-12-31 MED ORDER — DIPHENHYDRAMINE HCL 50 MG/ML IJ SOLN: 12.5000 mg | INTRAMUSCULAR | Status: DC | PRN

## 2013-12-31 MED ORDER — LIDOCAINE HCL (PF) 1 % IJ SOLN
INTRAMUSCULAR | Status: DC | PRN
  Administered 2013-12-31 (×2): 9 mL

## 2013-12-31 MED ORDER — CITRIC ACID-SODIUM CITRATE 334-500 MG/5ML PO SOLN: 30.0000 mL | ORAL | Status: DC | PRN

## 2013-12-31 MED ORDER — FENTANYL 2.5 MCG/ML BUPIVACAINE 1/10 % EPIDURAL INFUSION (WH - ANES)
INTRAMUSCULAR | Status: AC
  Filled 2013-12-31: qty 125

## 2013-12-31 MED ORDER — EPHEDRINE 5 MG/ML INJ
10.0000 mg | INTRAVENOUS | Status: DC | PRN
Start: 2013-12-31 — End: 2014-01-01
  Filled 2013-12-31: qty 2
  Filled 2013-12-31: qty 4

## 2013-12-31 NOTE — MAU Note (Signed)
Pt states here for contractions q10 minutes apart. Denies bleeding or lof.

## 2013-12-31 NOTE — Anesthesia Preprocedure Evaluation (Signed)
Anesthesia Evaluation  Patient identified by MRN, date of birth, ID band Patient awake    Reviewed: Allergy & Precautions, H&P , NPO status , Patient's Chart, lab work & pertinent test results  Airway Mallampati: II TM Distance: >3 FB Neck ROM: full    Dental no notable dental hx.    Pulmonary neg pulmonary ROS,  breath sounds clear to auscultation  Pulmonary exam normal       Cardiovascular negative cardio ROS      Neuro/Psych negative neurological ROS  negative psych ROS   GI/Hepatic negative GI ROS, Neg liver ROS,   Endo/Other  diabetesMorbid obesity  Renal/GU negative Renal ROS  negative genitourinary   Musculoskeletal negative musculoskeletal ROS (+)   Abdominal (+) + obese,   Peds negative pediatric ROS (+)  Hematology negative hematology ROS (+)   Anesthesia Other Findings   Reproductive/Obstetrics (+) Pregnancy                           Anesthesia Physical  Anesthesia Plan  ASA: III  Anesthesia Plan: Epidural   Post-op Pain Management:    Induction:   Airway Management Planned:   Additional Equipment:   Intra-op Plan:   Post-operative Plan:   Informed Consent: I have reviewed the patients History and Physical, chart, labs and discussed the procedure including the risks, benefits and alternatives for the proposed anesthesia with the patient or authorized representative who has indicated his/her understanding and acceptance.     Plan Discussed with:   Anesthesia Plan Comments:         Anesthesia Quick Evaluation

## 2013-12-31 NOTE — H&P (Signed)
Elaine Kelly is a 28 y.o. female, [id number] at 60 5/7 weeks, presenting for increased contractions every 10 min.  Reports +FM, denies bleeding  Patient Active Problem List   Diagnosis Date Noted  . Postpartum hemorrhage - approx 1500cc  01/01/2014  . Positive GBS test 01/01/2014  . Vaginal delivery 12/31/2013  . Lactating mother 11/30/2011  . Language barrier, cultural differences 11/30/2011  . Gestational diabetes mellitus, diet-controlled 11/25/2011  . 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 Obese 11/25/2011    History of present pregnancy: Patient entered care at 21 weeks by LMP, but 16 weeks by [place] at that visit.   EDC of 01/02/14 was established by [place] at 16 weeks  Anatomy scan:  18 5/7 weeks, with normal findings and an posterior placenta.   Additional [place] evaluations:   37 weeks:  EFW 6+10, 2998 gm, 38.7%ile, AFI 18.69, 70%ile, vtx. Significant prenatal events:  Random blood sugar 135 at 19 weeks, with Hgb A1C 5.8 at that time.  Glucola elevated at 27 weeks, with dx of GDM by 3 hour GTT.  Managed by diet throughout pregnancy, with normal growth and fluid at 37 weeks.  Hx contractions at times during pregnancy, without cervical change.  Treated for UTI at 32 weeks.  Treated for URI at 33-34 weeks with Zpak and Tessalon pearles.  [place] at 37 weeks due to ? Presentation--transverse, normal growth. Version scheduled at 38 weeks, but baby was vtx on admission for version.   Last evaluation:  12/22/13--still transverse on [place], but on 2/20 at time of scheduled version, baby vtx.  OB History   Grav Para Term Preterm Abortions TAB SAB Ect Mult Living   5 4 4  0 1 0 1 0 0 4    2006--SVB, 40 weeks, 24 hour labor, female, 6 lbs., delivered in [place], "had low BP during labor/delivery" 2010--SVB, 38 weeks, 6 hour labor, 7 lbs, female, delivered at [facility] with [facility] 2012--SAB at 12 weeks 2013--SVB, 37 weeks, 5 hours, 5+6, female, [facility]G with [facility], gestational diabetes.  Past Medical History  Diagnosis Date  . Diabetes  mellitus   . Gestational diabetes   Had spontaneous IUD expulsion 02/2013.  Past Surgical History  Procedure Laterality Date  . No past surgeries     Family History: family history includes Cancer in her maternal aunt; Diabetes in her paternal grandmother; Hypertension in her father.  Social History:  reports that she has never smoked. She has never used smokeless tobacco. She reports that she does not drink alcohol or use illicit drugs.  Husband is involved and supportive, also accompanied by her mother.  Patient is SAHM, Arabic in ethnicity.  Some limitation of English, but declines translator.   Prenatal Transfer Tool  Maternal Diabetes: Yes:  Diabetes Type:  Diet controlled Genetic Screening: Normal Quad screen Maternal Ultrasounds/Referrals: Normal Fetal Ultrasounds or other Referrals:  None Maternal Substance Abuse:  No Significant Maternal Medications:  None Significant Maternal Lab Results: Lab values include: Group B Strep positive  ROS:  Contractions, +FM  No Known Allergies  Blood pressure 113/69, pulse 94, temperature 98.2 F (36.8 C), temperature source Oral, resp. rate 20, height 5\' 7"  (1.702 m), weight 229 lb (103.874 kg), last menstrual period 04/01/2013, SpO2 100.00%, unknown if currently breastfeeding.  Chest clear Heart RRR without murmur Abd gravid, NT, FH 39 cm Pelvic: 7 cm, 100%, vtx, -2, IBOW Ext: WNL  FHR: Category 1 UCs:  q 10 min, mild/moderate  Prenatal labs: ABO, Rh: --/--/A POS (02/28 1913) Antibody: NEG (02/28  1913) Rubella:   Immune RPR: NON REACTIVE (02/28 1913)  HBsAg: Negative (09/02 0000)  HIV: Non-reactive (09/02 0000)  GBS: Positive (02/03 0000) Sickle cell/Hgb electrophoresis:  WNL Pap:  05/2011, WNL GC:  Negative 12/22/13 Chlamydia:  Negative 12/22/13 Genetic screenings:  Quad screen WNL Glucola:  189, 3 hour GTT 3/4 abnormal values Other:  Hgb A1C 5.8 on 08/08/13.   Assessment/Plan: IUP at 39.5wks Advanced Cervical  Dilation Early labor GBS Positive GDM-diet controlled  [facility]: [facility] consult with Dr. Kathie RhodesS. Rivard Routine [facility] orders Patient desires epidural Will place then, anticipate augmentation with AROM or Pitocin as needed. CBGs q4hrs Ampicillin 2g now  Nyra CapesLATHAM, VICKICNM, [place] 01/01/2014, 12:56 PM

## 2013-12-31 NOTE — Anesthesia Procedure Notes (Signed)
Epidural Patient location during procedure: OB Start time: 12/31/2013 8:10 PM End time: 12/31/2013 8:14 PM  Staffing Anesthesiologist: Leilani AbleHATCHETT, Reanne Nellums Performed by: anesthesiologist   Preanesthetic Checklist Completed: patient identified, surgical consent, pre-op evaluation, timeout performed, IV checked, risks and benefits discussed and monitors and equipment checked  Epidural Patient position: sitting Prep: site prepped and draped and DuraPrep Patient monitoring: continuous pulse ox and blood pressure Approach: midline Injection technique: LOR air  Needle:  Needle type: Tuohy  Needle gauge: 17 G Needle length: 9 cm and 9 Needle insertion depth: 5 cm cm Catheter type: closed end flexible Catheter size: 19 Gauge Catheter at skin depth: 10 cm Test dose: negative and Other  Assessment Sensory level: T9 Events: blood not aspirated, injection not painful, no injection resistance, negative IV test and no paresthesia  Additional Notes Reason for block:procedure for pain

## 2014-01-01 ENCOUNTER — Encounter (HOSPITAL_COMMUNITY): Payer: Self-pay | Admitting: *Deleted

## 2014-01-01 DIAGNOSIS — B951 Streptococcus, group B, as the cause of diseases classified elsewhere: Secondary | ICD-10-CM | POA: Diagnosis present

## 2014-01-01 LAB — CBC
HCT: 31.3 % — ABNORMAL LOW (ref 36.0–46.0)
HEMATOCRIT: 34.2 % — AB (ref 36.0–46.0)
Hemoglobin: 11.4 g/dL — ABNORMAL LOW (ref 12.0–15.0)
Hemoglobin: 10.3 g/dL — ABNORMAL LOW (ref 12.0–15.0)
MCH: 26.4 pg (ref 26.0–34.0)
MCH: 26.1 pg (ref 26.0–34.0)
MCHC: 33.3 g/dL (ref 30.0–36.0)
MCHC: 32.9 g/dL (ref 30.0–36.0)
MCV: 79.2 fL (ref 78.0–100.0)
MCV: 79.4 fL (ref 78.0–100.0)
Platelets: 129 10*3/uL — ABNORMAL LOW (ref 150–400)
Platelets: 132 10*3/uL — ABNORMAL LOW (ref 150–400)
RBC: 4.32 MIL/uL (ref 3.87–5.11)
RBC: 3.94 MIL/uL (ref 3.87–5.11)
RDW: 14.9 % (ref 11.5–15.5)
RDW: 15 % (ref 11.5–15.5)
WBC: 12.3 10*3/uL — ABNORMAL HIGH (ref 4.0–10.5)
WBC: 11.6 10*3/uL — ABNORMAL HIGH (ref 4.0–10.5)

## 2014-01-01 LAB — DIC (DISSEMINATED INTRAVASCULAR COAGULATION)PANEL
INR: 1.01 (ref 0.00–1.49)
Platelets: 129 10*3/uL — ABNORMAL LOW (ref 150–400)
Prothrombin Time: 13.1 seconds (ref 11.6–15.2)

## 2014-01-01 LAB — DIC (DISSEMINATED INTRAVASCULAR COAGULATION) PANEL
D DIMER QUANT: 4.61 ug{FEU}/mL — AB (ref 0.00–0.48)
FIBRINOGEN: 402 mg/dL (ref 204–475)
aPTT: 28 seconds (ref 24–37)

## 2014-01-01 LAB — POSTPARTUM HEMORRHAGE PROTOCOL (BB NOTIFICATION)

## 2014-01-01 LAB — GLUCOSE, RANDOM: Glucose, Bld: 98 mg/dL (ref 70–99)

## 2014-01-01 LAB — RPR: RPR: NONREACTIVE

## 2014-01-01 MED ORDER — CARBOPROST TROMETHAMINE 250 MCG/ML IM SOLN: 250.0000 ug | INTRAMUSCULAR | Status: DC | PRN

## 2014-01-01 MED ORDER — FLEET ENEMA 7-19 GM/118ML RE ENEM: 1.0000 | ENEMA | Freq: Every day | RECTAL | Status: DC | PRN

## 2014-01-01 MED ORDER — MISOPROSTOL 200 MCG PO TABS
ORAL_TABLET | ORAL | Status: AC
  Administered 2014-01-01: 600 ug
  Filled 2014-01-01: qty 4

## 2014-01-01 MED ORDER — MISOPROSTOL 200 MCG PO TABS
ORAL_TABLET | ORAL | Status: AC
  Filled 2014-01-01: qty 1

## 2014-01-01 MED ORDER — TETANUS-DIPHTH-ACELL PERTUSSIS 5-2.5-18.5 LF-MCG/0.5 IM SUSP: 0.5000 mL | Freq: Once | INTRAMUSCULAR | Status: DC

## 2014-01-01 MED ORDER — ONDANSETRON HCL 4 MG PO TABS: 4.0000 mg | ORAL_TABLET | ORAL | Status: DC | PRN

## 2014-01-01 MED ORDER — MEASLES, MUMPS & RUBELLA VAC ~~LOC~~ INJ
0.5000 mL | INJECTION | Freq: Once | SUBCUTANEOUS | Status: DC
Start: 2014-01-01 — End: 2014-01-01
  Filled 2014-01-01: qty 0.5

## 2014-01-01 MED ORDER — METHYLERGONOVINE MALEATE 0.2 MG/ML IJ SOLN: 0.2000 mg | Freq: Once | INTRAMUSCULAR | Status: DC

## 2014-01-01 MED ORDER — BENZOCAINE-MENTHOL 20-0.5 % EX AERO
1.0000 "application " | INHALATION_SPRAY | CUTANEOUS | Status: DC | PRN
  Administered 2014-01-01: 1 via TOPICAL
  Filled 2014-01-01: qty 56

## 2014-01-01 MED ORDER — IBUPROFEN 600 MG PO TABS
600.0000 mg | ORAL_TABLET | Freq: Four times a day (QID) | ORAL | Status: DC
  Administered 2014-01-01 – 2014-01-02 (×6): 600 mg via ORAL
  Filled 2014-01-01 (×6): qty 1

## 2014-01-01 MED ORDER — WITCH HAZEL-GLYCERIN EX PADS: 1.0000 "application " | MEDICATED_PAD | CUTANEOUS | Status: DC | PRN

## 2014-01-01 MED ORDER — OXYTOCIN 10 UNIT/ML IJ SOLN
40.0000 [IU] | INTRAMUSCULAR | Status: DC
  Filled 2014-01-01: qty 4

## 2014-01-01 MED ORDER — FENTANYL CITRATE 0.05 MG/ML IJ SOLN: 25.0000 ug | INTRAMUSCULAR | Status: DC | PRN

## 2014-01-01 MED ORDER — OXYTOCIN 40 UNITS IN LACTATED RINGERS INFUSION - SIMPLE MED
INTRAVENOUS | Status: AC
  Administered 2014-01-01: 40 [IU]
  Filled 2014-01-01: qty 1000

## 2014-01-01 MED ORDER — MISOPROSTOL 200 MCG PO TABS: 800.0000 ug | ORAL_TABLET | Freq: Once | ORAL | Status: DC

## 2014-01-01 MED ORDER — DIPHENHYDRAMINE HCL 25 MG PO CAPS: 25.0000 mg | ORAL_CAPSULE | Freq: Four times a day (QID) | ORAL | Status: DC | PRN

## 2014-01-01 MED ORDER — PRENATAL MULTIVITAMIN CH
1.0000 | ORAL_TABLET | Freq: Every day | ORAL | Status: DC
  Administered 2014-01-02: 1 via ORAL
  Filled 2014-01-01: qty 1

## 2014-01-01 MED ORDER — LACTATED RINGERS IV BOLUS (SEPSIS): 1000.0000 mL | Freq: Once | INTRAVENOUS | Status: DC

## 2014-01-01 MED ORDER — OXYTOCIN 40 UNITS IN LACTATED RINGERS INFUSION - SIMPLE MED: 250.0000 mL/h | INTRAVENOUS | Status: DC

## 2014-01-01 MED ORDER — SENNOSIDES-DOCUSATE SODIUM 8.6-50 MG PO TABS
2.0000 | ORAL_TABLET | ORAL | Status: DC
  Administered 2014-01-01: 2 via ORAL
  Filled 2014-01-01: qty 2

## 2014-01-01 MED ORDER — DIBUCAINE 1 % RE OINT: 1.0000 "application " | TOPICAL_OINTMENT | RECTAL | Status: DC | PRN

## 2014-01-01 MED ORDER — MEDROXYPROGESTERONE ACETATE 150 MG/ML IM SUSP: 150.0000 mg | INTRAMUSCULAR | Status: DC | PRN

## 2014-01-01 MED ORDER — LANOLIN HYDROUS EX OINT: TOPICAL_OINTMENT | CUTANEOUS | Status: DC | PRN

## 2014-01-01 MED ORDER — ONDANSETRON HCL 4 MG/2ML IJ SOLN: 4.0000 mg | INTRAMUSCULAR | Status: DC | PRN

## 2014-01-01 MED ORDER — OXYCODONE-ACETAMINOPHEN 5-325 MG PO TABS
1.0000 | ORAL_TABLET | ORAL | Status: DC | PRN
Start: 2014-01-01 — End: 2014-01-02
  Administered 2014-01-01 – 2014-01-02 (×6): 1 via ORAL
  Filled 2014-01-01 (×6): qty 1

## 2014-01-01 MED ORDER — BISACODYL 10 MG RE SUPP: 10.0000 mg | Freq: Every day | RECTAL | Status: DC | PRN

## 2014-01-01 MED ORDER — SIMETHICONE 80 MG PO CHEW: 80.0000 mg | CHEWABLE_TABLET | ORAL | Status: DC | PRN

## 2014-01-01 MED ORDER — ZOLPIDEM TARTRATE 5 MG PO TABS: 5.0000 mg | ORAL_TABLET | Freq: Every evening | ORAL | Status: DC | PRN

## 2014-01-01 NOTE — Progress Notes (Addendum)
Post Partum Day 1  RESPONSE TO CODE HEMORRHAGE  Subjective: cALLED to code hemorrhage for passage of a large (est 1000CC) of clots , mostly in bathroom, in this 27 yr grand multip now 5+ hours s/p uncomplicated SVD. Pt VS noted as pulse 100-110, BP 139-110/70-80, with bimanual exam revealing a firm thick muscular uterus, currently firm, with intermittent tendency to relax. 2 clots 200cc removed from lower ut segment. Uterus sufficiently firm that single digit cannot be passed thru the lower ut segment. Pt given cytotec 600 mcg orally and given IV pitocin bolus, will continue thru the nite. Pt currently not bleeding. Shelly CNM is here, and report given.  Objective: Blood pressure 139/96, pulse 110, temperature 98.4 F (36.9 C), temperature source Oral, resp. rate 20, height 5\' 7"  (1.702 m), weight 103.874 kg (229 lb), last menstrual period 04/01/2013, SpO2 100.00%, unknown if currently breastfeeding.  Physical Exam:  General: alert, cooperative and no distress Lochia: appropriate Uterine Fundus: firm at u-4 with massage. Incision:  DVT Evaluation: No evidence of DVT seen on physical exam.   Recent Labs  12/31/13 1945  HGB 12.8  HCT 38.4   Cbc rechecked, will repeat in a.m. 5 a. Assessment/Plan: Postpartum hemorrhage, responsive to Code hemorrhage Tx with Cytotec,and IV infusion Pitocin 40 units /liter at 62.5 cc/hr, to be continued til the a.m.   LOS: 1 day   Elaine Kelly V 01/01/2014, 2:27 AM

## 2014-01-01 NOTE — Progress Notes (Signed)
0200- Code Hemorrhage call- MB RN at bedside had expressed several large clots ~ 700 cc.  Dr. Estanislado Pandyivard called received orders. [id number]- Dr. Emelda FearFerguson at bedside assessing bleeding.  0215- Bimanual massage done by Dr. Emelda FearFerguson small clots expressed. Additional 500 cc expressed  Patient remained firm with continuous massage.  Scant to small amount of bleeding. Dr. Emelda FearFerguson remained at bedside.   0230Almond Lint- Shelly Lillard CNM at bedside. Received update and confirming orders.

## 2014-01-01 NOTE — Progress Notes (Signed)
S: called to BS by RN for postpartum hemorrhage while I was in a delivery. In house attending Dr Emelda Fear called to [facility] and Dr Estanislado Pandy notified. (see notes) When I arrived, several nurses in room attending to pt, clean linens and pads on,  Pt reports mild cramping  O: VSS FF-2U  Scant bleeding noted Results for orders placed during the hospital encounter of 12/31/13 (from the past 24 hour(s))  RPR     Status: None   Collection Time    12/31/13  7:13 PM      Result Value Ref Range   RPR NON REACTIVE  NON REACTIVE  TYPE AND SCREEN     Status: None   Collection Time    12/31/13  7:13 PM      Result Value Ref Range   ABO/RH(D) A POS     Antibody Screen NEG     Sample Expiration 01/03/2014     Unit Number [id number]     Blood Component Type RED CELLS,LR     Unit division 00     Status of Unit ALLOCATED     Transfusion Status OK TO TRANSFUSE     Crossmatch Result Compatible     Unit Number [id number]     Blood Component Type RED CELLS,LR     Unit division 00     Status of Unit ALLOCATED     Transfusion Status OK TO TRANSFUSE     Crossmatch Result Compatible    CBC     Status: Abnormal   Collection Time    12/31/13  7:45 PM      Result Value Ref Range   WBC 8.5  4.0 - 10.5 K/uL   RBC 4.81  3.87 - 5.11 MIL/uL   Hemoglobin 12.8  12.0 - 15.0 g/dL   HCT 30.8  65.7 - 84.6 %   MCV 79.8  78.0 - 100.0 fL   MCH 26.6  26.0 - 34.0 pg   MCHC 33.3  30.0 - 36.0 g/dL   RDW 96.2  95.2 - 84.1 %   Platelets 142 (*) 150 - 400 K/uL  GLUCOSE, CAPILLARY     Status: Abnormal   Collection Time    12/31/13  8:57 PM      Result Value Ref Range   Glucose-Capillary 112 (*) 70 - 99 mg/dL  POSTPARTUM HEMORRHAGE PROTOCOL (BB NOTIFICATION)     Status: None   Collection Time    01/01/14  2:05 AM      Result Value Ref Range   Initiate PPH protocol (BB notified) PPH ORDER RECEIVED    CBC     Status: Abnormal   Collection Time    01/01/14  2:10 AM      Result Value Ref Range   WBC 12.3 (*) 4.0 -  10.5 K/uL   RBC 4.32  3.87 - 5.11 MIL/uL   Hemoglobin 11.4 (*) 12.0 - 15.0 g/dL   HCT 32.4 (*) 40.1 - 02.7 %   MCV 79.2  78.0 - 100.0 fL   MCH 26.4  26.0 - 34.0 pg   MCHC 33.3  30.0 - 36.0 g/dL   RDW 25.3  66.4 - 40.3 %   Platelets 129 (*) 150 - 400 K/uL  DIC (DISSEMINATED INTRAVASCULAR COAGULATION) PANEL     Status: Abnormal   Collection Time    01/01/14  2:10 AM      Result Value Ref Range   Prothrombin Time 13.1  11.6 - 15.2 seconds   INR 1.01  0.00 - 1.49   aPTT 28  24 - 37 seconds   Fibrinogen 402  204 - 475 mg/dL   D-Dimer, Quant 1.614.61 (*) 0.00 - 0.48 ug/mL-FEU   Platelets 129 (*) 150 - 400 K/uL   Smear Review SCHISTOCYTES NOTED ON SMEAR     A: PPD1 SVD  PPH, stable  P: continue IVF's w 40units pitocin @ maintenance to complete total 1L  Repeat CBC at 7am   Dr Estanislado Pandyivard updated   ElaineViva Kelly, CNM

## 2014-01-01 NOTE — Progress Notes (Signed)
I was finishing a patient when I got a call that the patient in 127 had called for me.  When I got in the room, Felipa EthLisa Scott, RN, was in there. There was house coverage at bedside. I saw blood on the bed and sheets.  Misty StanleyLisa told me to call the doctor and code hemorrhage was called.  I called the patient's attending midwife, Leeann MustLillard, who was attending a delivery; I was asked to called Dr Estanislado Pandyivard, which I did.  During this time, Dr Emelda FearFerguson had arrived along with the hemorrhage team.  I had Dr Estanislado Pandyivard on the phone and Dr Emelda FearFerguson talked with her. She was given 600 mcg of cytotec orally, and an IV bolus, currently running at 62.5.

## 2014-01-01 NOTE — Progress Notes (Signed)
Post Partum Day 1:S/P SVB, PPH, 2nd degree laceration Subjective: Patient up ad lib, denies syncope or dizziness.  Has been up to BR, tolerated well. Feeding:  Breast Contraceptive plan:   Undecided at present  Objective: Blood pressure 113/69, pulse 94, temperature 98.2 F (36.8 C), temperature source Oral, resp. rate 20, height 5\' 7"  (1.702 m), weight 229 lb (103.874 kg), last menstrual period 04/01/2013, SpO2 100.00%, unknown if currently breastfeeding.  Filed Vitals:   01/01/14 0315 01/01/14 0415 01/01/14 0526 01/01/14 0725  BP: 108/60 119/69 122/68 113/69  Pulse: 88 83 86 94  Temp: 98.7 F (37.1 C) 98.4 F (36.9 C)  98.2 F (36.8 C)  TempSrc:    Oral  Resp: 20 18 18 20   Height:      Weight:      SpO2: 100% 100% 100%      Physical Exam:  General: alert Lochia: appropriate Uterine Fundus: firm Incision: healing well DVT Evaluation: No evidence of DVT seen on physical exam. Negative Homan's sign.   Recent Labs  01/01/14 0210 01/01/14 0603  HGB 11.4* 10.3*  HCT 34.2* 31.3*    Assessment/Plan: S/P Vaginal delivery day 1 S/P PPH--stable Hgb and clinical status Continue current care Will defer repeat CBC unless patient becomes symptomatic Plan for discharge tomorrow   LOS: 1 day   Elaine Kelly 01/01/2014, 10:25 AM

## 2014-01-01 NOTE — Anesthesia Postprocedure Evaluation (Signed)
Anesthesia Post Note  Patient: Elaine Kelly  Procedure(s) Performed: * No procedures listed *  Anesthesia type: Epidural  Patient location: Mother/Baby  Post pain: Pain level controlled  Post assessment: Post-op Vital signs reviewed  Last Vitals:  Filed Vitals:   01/01/14 0526  BP: 122/68  Pulse: 86  Temp:   Resp: 18    Post vital signs: Reviewed  Level of consciousness:alert  Complications: No apparent anesthesia complications

## 2014-01-02 NOTE — Progress Notes (Signed)
UR chart review completed.  

## 2014-01-02 NOTE — Plan of Care (Signed)
Problem: Discharge Progression Outcomes Goal: Barriers To Progression Addressed/Resolved Outcome: Completed/Met Date Met:  01/02/14 ppHemm 01/01/14

## 2014-01-02 NOTE — Discharge Summary (Signed)
Vaginal Delivery Discharge Summary  Elaine Kelly  DOB:    Apr 04, 1986 MRN:    [id number] CSN:    [id number]  Date of admission:                  12/31/13   Date of discharge:                   01/02/14  Procedures this admission:SVD by S.Rayven Rettig, CNM,  Ampicillin for GBS, Epidural, PPH   Date of Delivery: 12/31/13  Newborn Data:  Live born female  Birth Weight: 7 lb 3.2 oz (3266 g) APGAR: 8, 9  Home with mother.    History of Present Illness:  Ms. Elaine Kelly is a 28 y.o. female, [id number], who presents at 8783w5d weeks gestation. [facility]. She was admitted onset of labor. Her pregnancy has been complicated by: none.  Hospital course:  The patient was admitted for active  labor was not complicated. She proceeded to have a vaginal delivery of a healthy infant. Her delivery was not complicated. Her postpartum course was complicated by a delayed PPH approx 6hrs after delivery. She had approx 1500cc EBL, she rcv'd IV pitocin, oral cytotec and evacuation of clots and bimanual compression, w good recovery, pt vitals remained stable, hgb was 10.3, and pt was without sx's of anemia.  She was discharged to home on postpartum day 2 doing well.  Feeding:  breast  Contraception:  nexplanon  Discharge hemoglobin:  Hemoglobin  Date Value Ref Range Status  01/01/2014 10.3* 12.0 - 15.0 g/dL Final     HCT  Date Value Ref Range Status  01/01/2014 31.3* 36.0 - 46.0 % Final    Discharge Physical Exam:   General: alert and no distress Lochia: appropriate Uterine Fundus: firm Incision: healing well DVT Evaluation: No evidence of DVT seen on physical exam. Negative Homan's sign. No significant calf/ankle edema.  Intrapartum Procedures: spontaneous vaginal delivery, GBS prophylaxis and repair 2nd degree laceration Postpartum Procedures: PPH Complications-Operative  and Postpartum: hemorrhage  Discharge Diagnoses: Term Pregnancy-delivered  Discharge Information:  Activity:           pelvic rest Diet:                routine and iron rich Medications: PNV Condition:      stable Instructions:   Postpartum Care After Vaginal Delivery  After you deliver your newborn (postpartum period), the usual stay in the hospital is 24 72 hours. If there were problems with your labor or delivery, or if you have other medical problems, you might be in the hospital longer.  While you are in the hospital, you will receive help and instructions on how to care for yourself and your newborn during the postpartum period.  While you are in the hospital:  Be sure to tell your nurses if you have pain or discomfort, as well as where you feel the pain and what makes the pain worse.  If you had an incision made near your vagina (episiotomy) or if you had some tearing during delivery, the nurses may put ice packs on your episiotomy or tear. The ice packs may help to reduce the pain and swelling.  If you are breastfeeding, you may feel uncomfortable contractions of your uterus for a couple of weeks. This is normal. The contractions help your uterus get back to normal size.  It is normal to have some  bleeding after delivery.  For the first 1 3 days after delivery, the flow is red and the amount may be similar to a period.  It is common for the flow to start and stop.  In the first few days, you may pass some small clots. Let your nurses know if you begin to pass large clots or your flow increases.  Do not  flush blood clots down the toilet before having the nurse look at them.  During the next 3 10 days after delivery, your flow should become more watery and pink or brown-tinged in color.  Ten to fourteen days after delivery, your flow should be a small amount of yellowish-white discharge.  The amount of your flow will decrease over the first few weeks after delivery. Your  flow may stop in 6 8 weeks. Most women have had their flow stop by 12 weeks after delivery.  You should change your sanitary pads frequently.  Wash your hands thoroughly with soap and water for at least 20 seconds after changing pads, using the toilet, or before holding or feeding your newborn.  You should feel like you need to empty your bladder within the first 6 8 hours after delivery.  In case you become weak, lightheaded, or faint, call your nurse before you get out of bed for the first time and before you take a shower for the first time.  Within the first few days after delivery, your breasts may begin to feel tender and full. This is called engorgement. Breast tenderness usually goes away within 48 72 hours after engorgement occurs. You may also notice milk leaking from your breasts. If you are not breastfeeding, do not stimulate your breasts. Breast stimulation can make your breasts produce more milk.  Spending as much time as possible with your newborn is very important. During this time, you and your newborn can feel close and get to know each other. Having your newborn stay in your room (rooming in) will help to strengthen the bond with your newborn. It will give you time to get to know your newborn and become comfortable caring for your newborn.  Your hormones change after delivery. Sometimes the hormone changes can temporarily cause you to feel sad or tearful. These feelings should not last more than a few days. If these feelings last longer than that, you should talk to your caregiver.  If desired, talk to your caregiver about methods of family planning or contraception.  Talk to your caregiver about immunizations. Your caregiver may want you to have the following immunizations before leaving the hospital:  Tetanus, diphtheria, and pertussis (Tdap) or tetanus and diphtheria (Td) immunization. It is very important that you and your family (including grandparents) or others caring  for your newborn are up-to-date with the Tdap or Td immunizations. The Tdap or Td immunization can help protect your newborn from getting ill.  Rubella immunization.  Varicella (chickenpox) immunization.  Influenza immunization. You should receive this annual immunization if you did not receive the immunization during your pregnancy. Document Released: 08/17/2007 Document Revised: 07/14/2012 Document Reviewed: 06/16/2012 [facility] Patient Information 2014 [place], [place].   Postpartum Depression and Baby Blues  The postpartum period begins right after the birth of a baby. During this time, there is often a great amount of joy and excitement. It is also a time of considerable changes in the life of the parent(s). Regardless of how many times a mother gives birth, each child brings new challenges and dynamics to the family.  It is not unusual to have feelings of excitement accompanied by confusing shifts in moods, emotions, and thoughts. All mothers are at risk of developing postpartum depression or the "baby blues." These mood changes can occur right after giving birth, or they may occur many months after giving birth. The baby blues or postpartum depression can be mild or severe. Additionally, postpartum depression can resolve rather quickly, or it can be a long-term condition. CAUSES Elevated hormones and their rapid decline are thought to be a main cause of postpartum depression and the baby blues. There are a number of hormones that radically change during and after pregnancy. Estrogen and progesterone usually decrease immediately after delivering your baby. The level of thyroid hormone and various cortisol steroids also rapidly drop. Other factors that play a major role in these changes include major life events and genetics.  RISK FACTORS If you have any of the following risks for the baby blues or postpartum depression, know what symptoms to watch out for during the postpartum period. Risk  factors that may increase the likelihood of getting the baby blues or postpartum depression include:  Havinga personal or family history of depression.  Having depression while being pregnant.  Having premenstrual or oral contraceptive-associated mood issues.  Having exceptional life stress.  Having marital conflict.  Lacking a social support network.  Having a baby with special needs.  Having health problems such as diabetes. SYMPTOMS Baby blues symptoms include:  Brief fluctuations in mood, such as going from extreme happiness to sadness.  Decreased concentration.  Difficulty sleeping.  Crying spells, tearfulness.  Irritability.  Anxiety. Postpartum depression symptoms typically begin within the first month after giving birth. These symptoms include:  Difficulty sleeping or excessive sleepiness.  Marked weight loss.  Agitation.  Feelings of worthlessness.  Lack of interest in activity or food. Postpartum psychosis is a very concerning condition and can be dangerous. Fortunately, it is rare. Displaying any of the following symptoms is cause for immediate medical attention. Postpartum psychosis symptoms include:  Hallucinations and delusions.  Bizarre or disorganized behavior.  Confusion or disorientation. DIAGNOSIS  A diagnosis is made by an evaluation of your symptoms. There are no medical or lab tests that lead to a diagnosis, but there are various questionnaires that a caregiver may use to identify those with the baby blues, postpartum depression, or psychosis. Often times, a screening tool called the [place] Postnatal Depression Scale is used to diagnose depression in the postpartum period.  TREATMENT The baby blues usually goes away on its own in 1 to 2 weeks. Social support is often all that is needed. You should be encouraged to get adequate sleep and rest. Occasionally, you may be given medicines to help you sleep.  Postpartum depression requires  treatment as it can last several months or longer if it is not treated. Treatment may include individual or group therapy, medicine, or both to address any social, physiological, and psychological factors that may play a role in the depression. Regular exercise, a healthy diet, rest, and social support may also be strongly recommended.  Postpartum psychosis is more serious and needs treatment right away. Hospitalization is often needed. HOME CARE INSTRUCTIONS  Get as much rest as you can. Nap when the baby sleeps.  Exercise regularly. Some women find yoga and walking to be beneficial.  Eat a balanced and nourishing diet.  Do little things that you enjoy. Have a cup of tea, take a bubble bath, read your favorite magazine, or listen to your  favorite music.  Avoid alcohol.  Ask for help with household chores, cooking, grocery shopping, or running errands as needed. Do not try to do everything.  Talk to people close to you about how you are feeling. Get support from your partner, family members, friends, or other new moms.  Try to stay positive in how you think. Think about the things you are grateful for.  Do not spend a lot of time alone.  Only take medicine as directed by your caregiver.  Keep all your postpartum appointments.  Let your caregiver know if you have any concerns. SEEK MEDICAL CARE IF: You are having a reaction or problems with your medicine. SEEK IMMEDIATE MEDICAL CARE IF:  You have suicidal feelings.  You feel you may harm the baby or someone else. Document Released: 07/24/2004 Document Revised: 01/12/2012 Document Reviewed: 08/26/2011 [facility] Patient Information 2014 [place], [place].   Discharge to: home  Follow-up Information   Follow up with [facility] In 6 weeks.   Specialty:  Obstetrics and Gynecology   Contact information:   [address] [place] [address] [phone]       Malissa Hippo 01/02/2014

## 2014-01-02 NOTE — Discharge Instructions (Signed)

## 2014-01-04 LAB — TYPE AND SCREEN
ABO/RH(D): A POS
Antibody Screen: NEGATIVE
Unit division: 0
Unit division: 0

## 2014-03-29 ENCOUNTER — Ambulatory Visit: Payer: Medicaid Other | Attending: Internal Medicine

## 2014-03-29 ENCOUNTER — Ambulatory Visit: Payer: Medicaid Other

## 2014-07-22 ENCOUNTER — Emergency Department (HOSPITAL_COMMUNITY)
Admission: EM | Admit: 2014-07-22 | Discharge: 2014-07-22 | Disposition: A | Discharge status: Home / Self Care | Payer: Medicaid Other | Attending: Emergency Medicine | Admitting: Emergency Medicine

## 2014-07-22 ENCOUNTER — Encounter (HOSPITAL_COMMUNITY): Payer: Self-pay | Admitting: Emergency Medicine

## 2014-07-22 DIAGNOSIS — M545 Low back pain, unspecified: Secondary | ICD-10-CM | POA: Diagnosis not present

## 2014-07-22 DIAGNOSIS — E119 Type 2 diabetes mellitus without complications: Secondary | ICD-10-CM | POA: Diagnosis not present

## 2014-07-22 DIAGNOSIS — R109 Unspecified abdominal pain: Secondary | ICD-10-CM | POA: Diagnosis present

## 2014-07-22 DIAGNOSIS — Z791 Long term (current) use of non-steroidal anti-inflammatories (NSAID): Secondary | ICD-10-CM | POA: Insufficient documentation

## 2014-07-22 DIAGNOSIS — Z3202 Encounter for pregnancy test, result negative: Secondary | ICD-10-CM | POA: Diagnosis not present

## 2014-07-22 LAB — URINALYSIS, ROUTINE W REFLEX MICROSCOPIC
Bilirubin Urine: NEGATIVE
Glucose, UA: NEGATIVE mg/dL
HGB URINE DIPSTICK: NEGATIVE
KETONES UR: NEGATIVE mg/dL
Leukocytes, UA: NEGATIVE
Nitrite: NEGATIVE
PROTEIN: NEGATIVE mg/dL
Specific Gravity, Urine: 1.009 (ref 1.005–1.030)
Urobilinogen, UA: 0.2 mg/dL (ref 0.0–1.0)
pH: 7 (ref 5.0–8.0)

## 2014-07-22 LAB — POC URINE PREG, ED: Preg Test, Ur: NEGATIVE

## 2014-07-22 MED ORDER — NAPROXEN 375 MG PO TABS: 375.0000 mg | ORAL_TABLET | Freq: Two times a day (BID) | ORAL | Status: DC

## 2014-07-22 NOTE — ED Provider Notes (Signed)
CSN: [id number]     Arrival date & time 07/22/14  1449 History   First MD Initiated Contact with Patient 07/22/14 1603     Chief Complaint  Patient presents with  . Flank Pain     (Consider location/radiation/quality/duration/timing/severity/associated sxs/prior Treatment) HPI Comments: Patient presents with right lower back pain for 3 days. She states it was worse this morning. She denies to me any urinary symptoms. She stated in triage that she's having burning on urination. She denies to me any frequency, burning or difficulty urinating. She denies any vaginal bleeding or discharge. She denies any nausea vomiting or fevers. She denies abdominal pain. She states the pain is worse with movement. She took ibuprofen which improved the symptoms. She denies any radiation down her legs. She denies any numbness or weakness in her legs.  Patient is a 28 y.o. female presenting with flank pain.  Flank Pain Pertinent negatives include no chest pain, no abdominal pain, no headaches and no shortness of breath.    Past Medical History  Diagnosis Date  . Diabetes mellitus   . Gestational diabetes    Past Surgical History  Procedure Laterality Date  . No past surgeries     Family History  Problem Relation Age of Onset  . Hypertension Father   . Cancer Maternal Aunt     leaukemia  . Diabetes Paternal Grandmother    History  Substance Use Topics  . Smoking status: Never Smoker   . Smokeless tobacco: Never Used  . Alcohol Use: No   OB History   Grav Para Term Preterm Abortions TAB SAB Ect Mult Living   0 1 0 1 0 0 4     Review of Systems  Constitutional: Negative for fever, chills, diaphoresis and fatigue.  HENT: Negative for congestion, rhinorrhea and sneezing.   Eyes: Negative.   Respiratory: Negative for cough, chest tightness and shortness of breath.   Cardiovascular: Negative for chest pain and leg swelling.  Gastrointestinal: Negative for nausea, vomiting, abdominal pain,  diarrhea and blood in stool.  Genitourinary: Positive for flank pain. Negative for frequency, hematuria, vaginal bleeding, vaginal discharge and difficulty urinating.  Musculoskeletal: Positive for back pain. Negative for arthralgias.  Skin: Negative for rash.  Neurological: Negative for dizziness, speech difficulty, weakness, numbness and headaches.      Allergies  Review of patient's allergies indicates no known allergies.  Home Medications   Prior to Admission medications   Medication Sig Start Date End Date Taking? Authorizing Provider  ibuprofen (ADVIL,MOTRIN) 200 MG tablet Take 400 mg by mouth every 6 (six) hours as needed for moderate pain.   Yes Historical Provider, MD  naproxen (NAPROSYN) 375 MG tablet Take 1 tablet (375 mg total) by mouth 2 (two) times daily. 07/22/14   Rolan Bucco, MD   BP 126/87  Pulse 80  Temp(Src) 98.4 F (36.9 C) (Oral)  Resp 12  SpO2 97%  LMP 06/28/2014 Physical Exam  Constitutional: She is oriented to person, place, and time. She appears well-developed and well-nourished.  HENT:  Head: Normocephalic and atraumatic.  Eyes: Pupils are equal, round, and reactive to light.  Neck: Normal range of motion. Neck supple.  Cardiovascular: Normal rate, regular rhythm and normal heart sounds.   Pulmonary/Chest: Effort normal and breath sounds normal. No respiratory distress. She has no wheezes. She has no rales. She exhibits no tenderness.  Abdominal: Soft. Bowel sounds are normal. There is no tenderness. There is no rebound and no guarding.  Musculoskeletal:  Normal range of motion. She exhibits no edema.  Patient has mild reproducible tenderness to the musculature of the right lumbar area. She has no pain over the sciatic nerve. She has normal sensation and motor function in the legs. She has no spinal tenderness.  Lymphadenopathy:    She has no cervical adenopathy.  Neurological: She is alert and oriented to person, place, and time.  Skin: Skin is warm  and dry. No rash noted.  Psychiatric: She has a normal mood and affect.    ED Course  Procedures (including critical care time) Labs Review Results for orders placed during the hospital encounter of 07/22/14  URINALYSIS, ROUTINE W REFLEX MICROSCOPIC      Result Value Ref Range   Color, Urine YELLOW  YELLOW   APPearance CLEAR  CLEAR   Specific Gravity, Urine 1.009  1.005 - 1.030   pH 7.0  5.0 - 8.0   Glucose, UA NEGATIVE  NEGATIVE mg/dL   Hgb urine dipstick NEGATIVE  NEGATIVE   Bilirubin Urine NEGATIVE  NEGATIVE   Ketones, ur NEGATIVE  NEGATIVE mg/dL   Protein, ur NEGATIVE  NEGATIVE mg/dL   Urobilinogen, UA 0.2  0.0 - 1.0 mg/dL   Nitrite NEGATIVE  NEGATIVE   Leukocytes, UA NEGATIVE  NEGATIVE  POC URINE PREG, ED      Result Value Ref Range   Preg Test, Ur NEGATIVE  NEGATIVE   No results found.    Imaging Review No results found.   EKG Interpretation None      MDM   Final diagnoses:  Right-sided low back pain without sciatica    Patient presents with pain to her right lower back area. She has absolutely no abdominal tenderness. She has a normal-appearing urine. She has no suggestions of a kidney stone or UTI. She has no neurologic dysfunction. I feel this is likely musculoskeletal back pain. She's given a prescription of Naprosyn. She was encouraged to followup with her primary care physician if her symptoms worsen or are not improving.    Rolan Bucco, MD 07/22/14 [id number]

## 2014-07-22 NOTE — ED Notes (Signed)
Pt reports having right side flank pain x 3 days. Having burning pain with urination today.

## 2014-07-22 NOTE — Discharge Instructions (Signed)
Back Pain, Adult Low back pain is very common. About 1 in 5 people have back pain.The cause of low back pain is rarely dangerous. The pain often gets better over time.About half of people with a sudden onset of back pain feel better in just 2 weeks. About 8 in 10 people feel better by 6 weeks.  CAUSES Some common causes of back pain include:  Strain of the muscles or ligaments supporting the spine.  Wear and tear (degeneration) of the spinal discs.  Arthritis.  Direct injury to the back. DIAGNOSIS Most of the time, the direct cause of low back pain is not known.However, back pain can be treated effectively even when the exact cause of the pain is unknown.Answering your caregiver's questions about your overall health and symptoms is one of the most accurate ways to make sure the cause of your pain is not dangerous. If your caregiver needs more information, he or she may order lab work or imaging tests (X-rays or MRIs).However, even if imaging tests show changes in your back, this usually does not require surgery. HOME CARE INSTRUCTIONS For many people, back pain returns.Since low back pain is rarely dangerous, it is often a condition that people can learn to manageon their own.   Remain active. It is stressful on the back to sit or stand in one place. Do not sit, drive, or stand in one place for more than 30 minutes at a time. Take short walks on level surfaces as soon as pain allows.Try to increase the length of time you walk each day.  Do not stay in bed.Resting more than 1 or 2 days can delay your recovery.  Do not avoid exercise or work.Your body is made to move.It is not dangerous to be active, even though your back may hurt.Your back will likely heal faster if you return to being active before your pain is gone.  Pay attention to your body when you bend and lift. Many people have less discomfortwhen lifting if they bend their knees, keep the load close to their bodies,and  avoid twisting. Often, the most comfortable positions are those that put less stress on your recovering back.  Find a comfortable position to sleep. Use a firm mattress and lie on your side with your knees slightly bent. If you lie on your back, put a pillow under your knees.  Only take over-the-counter or prescription medicines as directed by your caregiver. Over-the-counter medicines to reduce pain and inflammation are often the most helpful.Your caregiver may prescribe muscle relaxant drugs.These medicines help dull your pain so you can more quickly return to your normal activities and healthy exercise.  Put ice on the injured area.  Put ice in a plastic bag.  Place a towel between your skin and the bag.  Leave the ice on for 15-20 minutes, 03-04 times a day for the first 2 to 3 days. After that, ice and heat may be alternated to reduce pain and spasms.  Ask your caregiver about trying back exercises and gentle massage. This may be of some benefit.  Avoid feeling anxious or stressed.Stress increases muscle tension and can worsen back pain.It is important to recognize when you are anxious or stressed and learn ways to manage it.Exercise is a great option. SEEK MEDICAL CARE IF:  You have pain that is not relieved with rest or medicine.  You have pain that does not improve in 1 week.  You have new symptoms.  You are generally not feeling well. SEEK   IMMEDIATE MEDICAL CARE IF:   You have pain that radiates from your back into your legs.  You develop new bowel or bladder control problems.  You have unusual weakness or numbness in your arms or legs.  You develop nausea or vomiting.  You develop abdominal pain.  You feel faint. Document Released: 10/20/2005 Document Revised: 04/20/2012 Document Reviewed: 02/21/2014 [employer] Patient Information 2015 [employer]. This information is not intended to replace advice given to you by your health care provider. Make sure you  discuss any questions you have with your health care provider.  

## 2014-09-04 ENCOUNTER — Encounter (HOSPITAL_COMMUNITY): Payer: Self-pay | Admitting: Emergency Medicine

## 2015-04-23 ENCOUNTER — Ambulatory Visit: Payer: Medicaid Other | Attending: Internal Medicine

## 2018-11-03 NOTE — L&D Delivery Note (Addendum)
Delivery Note Elaine Kelly is a 33 y.o. P5T6144 at [redacted]w[redacted]d admitted for SOL and SROM.  Labor course: uncomplicated ROM: 0h 25m with clear fluid  At 1144 a viable and healthy girl was delivered via spontaneous vaginal delivery (Presentation: cephalic; ROA ).  Infant placed directly on mom's abdomen for bonding/skin-to-skin. Delayed cord clamping x , then cord clamped x 2, and cut by student nurse midwife.  APGAR: 9, 9; weight: pending at time of note.  40 units of pitocin diluted in 1000cc LR was infused rapidly IV per protocol. The placenta separated spontaneously and delivered via CCT and maternal pushing effort.  It was inspected and appears to be intact with a 3 VC.  Placenta/Cord with the following complications: none.  Intrapartum complications:  None Anesthesia:  none Episiotomy: none Lacerations:  1st degree Suture Repair: none Est. Blood Loss (mL): 200 Sponge and instrument count were correct x2.  Mom to postpartum.  Baby to Couplet care / Skin to Skin. Placenta to L&D. Plans to breast feed Contraception: unsure   Brand Males [facility] 01/01/2019 11:57 AM   I was present and gloved for the entire delivery. I agree with the above note.   Thressa Sheller DNP, CNM  01/01/19  12:02 PM

## 2018-12-21 ENCOUNTER — Ambulatory Visit (INDEPENDENT_AMBULATORY_CARE_PROVIDER_SITE_OTHER): Payer: Medicaid Other | Admitting: Student

## 2018-12-21 ENCOUNTER — Encounter: Payer: Medicaid Other | Attending: Obstetrics & Gynecology | Admitting: *Deleted

## 2018-12-21 ENCOUNTER — Encounter: Payer: Self-pay | Admitting: Student

## 2018-12-21 ENCOUNTER — Other Ambulatory Visit (HOSPITAL_COMMUNITY)
Admission: RE | Admit: 2018-12-21 | Discharge: 2018-12-21 | Disposition: A | Discharge status: Home / Self Care | Payer: Medicaid Other | Source: Ambulatory Visit | Attending: Student | Admitting: Student

## 2018-12-21 ENCOUNTER — Ambulatory Visit: Payer: Self-pay | Admitting: *Deleted

## 2018-12-21 ENCOUNTER — Encounter (HOSPITAL_COMMUNITY): Payer: Self-pay

## 2018-12-21 VITALS — BP 93/66 | HR 98 | Wt 234.0 lb

## 2018-12-21 DIAGNOSIS — O0993 Supervision of high risk pregnancy, unspecified, third trimester: Secondary | ICD-10-CM | POA: Diagnosis not present

## 2018-12-21 DIAGNOSIS — Z713 Dietary counseling and surveillance: Secondary | ICD-10-CM | POA: Insufficient documentation

## 2018-12-21 DIAGNOSIS — O24415 Gestational diabetes mellitus in pregnancy, controlled by oral hypoglycemic drugs: Secondary | ICD-10-CM | POA: Diagnosis not present

## 2018-12-21 DIAGNOSIS — O0990 Supervision of high risk pregnancy, unspecified, unspecified trimester: Secondary | ICD-10-CM

## 2018-12-21 DIAGNOSIS — E119 Type 2 diabetes mellitus without complications: Secondary | ICD-10-CM | POA: Diagnosis not present

## 2018-12-21 DIAGNOSIS — D696 Thrombocytopenia, unspecified: Secondary | ICD-10-CM | POA: Insufficient documentation

## 2018-12-21 DIAGNOSIS — Z3A36 36 weeks gestation of pregnancy: Secondary | ICD-10-CM | POA: Diagnosis not present

## 2018-12-21 MED ORDER — GLUCOSE BLOOD VI STRP: ORAL_STRIP | 12 refills | Status: DC

## 2018-12-21 MED ORDER — ACCU-CHEK GUIDE W/DEVICE KIT: 1.0000 | PACK | Freq: Once | 0 refills | Status: AC

## 2018-12-21 MED ORDER — ACCU-CHEK FASTCLIX LANCETS MISC: 1.0000 | Freq: Four times a day (QID) | 12 refills | Status: DC

## 2018-12-21 MED ORDER — METFORMIN HCL 1000 MG PO TABS: 1000.0000 mg | ORAL_TABLET | Freq: Two times a day (BID) | ORAL | 1 refills | Status: DC

## 2018-12-21 MED ORDER — METFORMIN HCL 1000 MG PO TABS: 1000.0000 mg | ORAL_TABLET | Freq: Every day | ORAL | 1 refills | Status: DC

## 2018-12-21 NOTE — Progress Notes (Signed)
  Patient was seen on 12/21/2018 for Gestational Diabetes self-management to provide patient with a meter and teach her how to use it. . EDD 01/15/2019. Patient speaks Arabic, live interpretor here for this visit.  Patient states history of GDM with last pregnancy and states she is already on Gliburide, Metformin was added today.  Diet history will be obtained at next visit in 10 days.  The following learning objectives were met by the patient :   States when to check blood glucose levels  Demonstrates proper blood glucose monitoring techniques  The rest of Gestational Diabetes Education will take place next Thursday, December 30, 2018.  Plan:  Begin checking BG before breakfast and 2 hours after first bite of breakfast, lunch and dinner as directed by MD  Bring Log Book/Sheet to every medical appointment   Baby Scripts:  Patient not appropriate for Baby Scripts due to language barrier  Take medication if directed by MD  Blood glucose monitor Rx called into pharmacy: [place] with Fast Clix drums Patient instructed to test pre breakfast and 2 hours each meal as directed by MD  Patient instructed to monitor glucose levels: FBS: 60 - 95 mg/dl 2 hour: <120 mg/dl  Patient received the following handouts: in Arabic  Nutrition Diabetes and Pregnancy  Carbohydrate Counting List  BG Log Sheet  Patient will be seen for follow-up in 1 1/2 weeks to complete GDM Education

## 2018-12-21 NOTE — Progress Notes (Signed)
  Subjective:    Elaine Kelly is being seen today for her first obstetrical visit.  This is a planned pregnancy. She is at 85w3dgestation. Her obstetrical history is significant for late to prenatal care; current GDM on metformin 1000 at night (per patient). . Relationship with FOB: spouse, living together. Patient does intend to breast feed. Pregnancy history fully reviewed. [place] but did not bring them today as they are in Arabic and "someone told her that we can't use them". [place] that [place]4.[place] lbs[place] She has not been checking her sugars often because she doesn't have supplies. She knows how to check her sugars. She has not had any c-sections in the past or problems with her blood pressure.   Patient reports no complaints.  Review of Systems:   Review of Systems  Constitutional: Negative.   HENT: Negative.   Cardiovascular: Negative.   Gastrointestinal: Negative.   Genitourinary: Negative.   Musculoskeletal: Negative.     Objective:     BP 93/66   Pulse 98   Wt 234 lb (106.1 kg)   LMP 04/10/2018 (Exact Date)   BMI 36.65 kg/m  Physical Exam  Constitutional: She appears well-developed.  Eyes: Pupils are equal, round, and reactive to light.  Neck: Normal range of motion.  Respiratory: Effort normal.  GI: Soft.  Genitourinary:    Vagina normal.   Musculoskeletal: Normal range of motion.  Neurological: She is alert.  Skin: Skin is warm and dry.    Exam    Assessment:    Pregnancy: [id number] Active Problem List   Diagnosis Date Noted  . Thrombocytopenic disorder ([place] 12/21/2018  . Supervision of high risk pregnancy, antepartum 12/21/2018  . Gestational diabetes mellitus treated with oral hypoglycemic therapy 12/21/2018  . Postpartum hemorrhage - approx 1500cc  01/01/2014  . Positive GBS test 01/01/2014  .  Vaginal delivery 12/31/2013  . Lactating mother 11/30/2011  . Language barrier, cultural differences 11/30/2011  . Gestational diabetes mellitus, diet-controlled 11/25/2011  . 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 Obese 11/25/2011       Plan:      Initial labs drawn. Prenatal vitamins. Problem list reviewed and updated. AFP3 discussed: too late. Role of ultrasound in pregnancy discussed; fetal survey: ordered. Amniocentesis discussed: ordered. Follow up in 1 weeks. 90 % of60  min visit spent on counseling and coordination of care.  -Extensive scheduling and arrangements were made today in order to manage patient's GDM.  Medicine: Continue Metformin at night  Labs: Will do 1 hour and HgA1c today, as well as NOB labs plus cultures.   Blood sugar monitoring: Bev met briefly with patient to give her supplies and teach her the monitor; I reviewed as well how to document her blood sugars.   Fetal assessment: Patient has limited availability for appts, but RN and I stressed importance of NSt/BPP and growth [place] soon as possible. Discussed on the phone (twice) with husband to arrange a scheduling of NST/BPP and [place] week and next week. Impressed upon him the high risk nature of her pregnancy and the reason for frequent [place]  Patient had NST today.   At next visit, please discuss contraception, feeding method and other ob-box items.   KMervyn SkeetersKBaylor Scott & White [facility]2/18/2020

## 2018-12-22 ENCOUNTER — Ambulatory Visit (HOSPITAL_COMMUNITY): Payer: Self-pay

## 2018-12-22 LAB — OBSTETRIC PANEL, INCLUDING HIV
Antibody Screen: NEGATIVE
BASOS ABS: 0 10*3/uL (ref 0.0–0.2)
Basos: 1 %
EOS (ABSOLUTE): 0 10*3/uL (ref 0.0–0.4)
Eos: 1 %
HEMATOCRIT: 36.4 % (ref 34.0–46.6)
HIV Screen 4th Generation wRfx: NONREACTIVE
Hemoglobin: 12.4 g/dL (ref 11.1–15.9)
Hepatitis B Surface Ag: NEGATIVE
Immature Grans (Abs): 0 10*3/uL (ref 0.0–0.1)
Immature Granulocytes: 1 %
Lymphocytes Absolute: 1.7 10*3/uL (ref 0.7–3.1)
Lymphs: 30 %
MCH: 27 pg (ref 26.6–33.0)
MCHC: 34.1 g/dL (ref 31.5–35.7)
MCV: 79 fL (ref 79–97)
MONOCYTES: 7 %
Monocytes Absolute: 0.4 10*3/uL (ref 0.1–0.9)
Neutrophils Absolute: 3.6 10*3/uL (ref 1.4–7.0)
Neutrophils: 60 %
PLATELETS: 172 10*3/uL (ref 150–450)
RBC: 4.59 x10E6/uL (ref 3.77–5.28)
RDW: 13.4 % (ref 11.7–15.4)
RPR: NONREACTIVE
RUBELLA: 16.1 {index} (ref >0.99)
Rh Factor: POSITIVE
WBC: 5.8 10*3/uL (ref 3.4–10.8)

## 2018-12-22 LAB — HEMOGLOBIN A1C
Est. average glucose Bld gHb Est-mCnc: 131 mg/dL
Hgb A1c MFr Bld: 6.2 % — ABNORMAL HIGH (ref 4.8–5.6)

## 2018-12-22 LAB — GLUCOSE TOLERANCE, 1 HOUR: Glucose, 1Hr PP: 174 mg/dL (ref 65–199)

## 2018-12-22 LAB — GC/CHLAMYDIA PROBE AMP (~~LOC~~) NOT AT ARMC
CHLAMYDIA, DNA PROBE: NEGATIVE
Neisseria Gonorrhea: NEGATIVE

## 2018-12-23 ENCOUNTER — Ambulatory Visit (HOSPITAL_COMMUNITY)
Admission: RE | Admit: 2018-12-23 | Discharge: 2018-12-23 | Disposition: A | Discharge status: Home / Self Care | Payer: Medicaid Other | Source: Ambulatory Visit | Attending: Student | Admitting: Student

## 2018-12-23 ENCOUNTER — Ambulatory Visit (HOSPITAL_COMMUNITY)
Admission: RE | Admit: 2018-12-23 | Discharge: 2018-12-23 | Disposition: A | Discharge status: Home / Self Care | Payer: Medicaid Other | Source: Ambulatory Visit | Attending: Obstetrics and Gynecology | Admitting: Obstetrics and Gynecology

## 2018-12-23 ENCOUNTER — Other Ambulatory Visit: Payer: Self-pay | Admitting: Student

## 2018-12-23 ENCOUNTER — Encounter (HOSPITAL_COMMUNITY): Payer: Self-pay

## 2018-12-23 DIAGNOSIS — O24415 Gestational diabetes mellitus in pregnancy, controlled by oral hypoglycemic drugs: Secondary | ICD-10-CM

## 2018-12-23 DIAGNOSIS — O99213 Obesity complicating pregnancy, third trimester: Secondary | ICD-10-CM

## 2018-12-23 DIAGNOSIS — O0990 Supervision of high risk pregnancy, unspecified, unspecified trimester: Secondary | ICD-10-CM

## 2018-12-23 DIAGNOSIS — O0933 Supervision of pregnancy with insufficient antenatal care, third trimester: Secondary | ICD-10-CM | POA: Diagnosis not present

## 2018-12-23 DIAGNOSIS — Z363 Encounter for antenatal screening for malformations: Secondary | ICD-10-CM | POA: Diagnosis not present

## 2018-12-23 DIAGNOSIS — Z3A36 36 weeks gestation of pregnancy: Secondary | ICD-10-CM

## 2018-12-23 NOTE — Procedures (Signed)
Elaine Kelly 1986-06-27 [redacted]w[redacted]d  Fetus A Non-Stress Test Interpretation for 12/23/18  Indication: Unsatisfactory BPP  Fetal Heart Rate A Mode: External Baseline Rate (A): 140 bpm Variability: Moderate Accelerations: 15 x 15 Decelerations: None Multiple birth?: No  Uterine Activity Mode: Palpation, Toco Contraction Frequency (min): none Resting Tone Palpated: Relaxed Resting Time: Adequate  Interpretation (Fetal Testing) Nonstress Test Interpretation: Reactive Overall Impression: Reassuring for gestational age Comments: FHR tracing rev'd with Dr. Judeth Cornfield

## 2018-12-24 LAB — CULTURE, BETA STREP (GROUP B ONLY): Strep Gp B Culture: POSITIVE — AB

## 2018-12-28 ENCOUNTER — Telehealth: Payer: Self-pay | Admitting: *Deleted

## 2018-12-28 ENCOUNTER — Encounter: Payer: Self-pay | Admitting: Student

## 2018-12-28 NOTE — Telephone Encounter (Addendum)
Contacted the pt's husband and I informed him that unfortuately Tylenol is only that she can take.  I encourage him to take her to urgent care to be seen and I will send a referral for a dentist.  He stated thank you with no further questions.

## 2018-12-28 NOTE — Telephone Encounter (Signed)
Voicemail message left by pt's husband on 2/21 stating that she is having tooth pain. Tylenol is not helping the pain and she could not sleep. Pt requests a call back.

## 2018-12-30 ENCOUNTER — Other Ambulatory Visit: Payer: Self-pay

## 2018-12-30 ENCOUNTER — Ambulatory Visit: Payer: Self-pay

## 2018-12-30 ENCOUNTER — Telehealth (HOSPITAL_COMMUNITY): Payer: Self-pay | Admitting: *Deleted

## 2018-12-30 ENCOUNTER — Encounter: Payer: Medicaid Other | Attending: Obstetrics & Gynecology | Admitting: *Deleted

## 2018-12-30 ENCOUNTER — Ambulatory Visit (INDEPENDENT_AMBULATORY_CARE_PROVIDER_SITE_OTHER): Payer: Medicaid Other | Admitting: Obstetrics & Gynecology

## 2018-12-30 ENCOUNTER — Ambulatory Visit: Payer: Medicaid Other | Admitting: *Deleted

## 2018-12-30 ENCOUNTER — Ambulatory Visit (INDEPENDENT_AMBULATORY_CARE_PROVIDER_SITE_OTHER): Payer: Medicaid Other | Admitting: *Deleted

## 2018-12-30 VITALS — BP 126/76 | HR 93 | Wt 234.7 lb

## 2018-12-30 DIAGNOSIS — E119 Type 2 diabetes mellitus without complications: Secondary | ICD-10-CM | POA: Insufficient documentation

## 2018-12-30 DIAGNOSIS — O0993 Supervision of high risk pregnancy, unspecified, third trimester: Secondary | ICD-10-CM

## 2018-12-30 DIAGNOSIS — Z3A37 37 weeks gestation of pregnancy: Secondary | ICD-10-CM

## 2018-12-30 DIAGNOSIS — O24415 Gestational diabetes mellitus in pregnancy, controlled by oral hypoglycemic drugs: Secondary | ICD-10-CM

## 2018-12-30 DIAGNOSIS — Z23 Encounter for immunization: Secondary | ICD-10-CM

## 2018-12-30 DIAGNOSIS — Z713 Dietary counseling and surveillance: Secondary | ICD-10-CM | POA: Insufficient documentation

## 2018-12-30 DIAGNOSIS — O0990 Supervision of high risk pregnancy, unspecified, unspecified trimester: Secondary | ICD-10-CM

## 2018-12-30 DIAGNOSIS — B951 Streptococcus, group B, as the cause of diseases classified elsewhere: Secondary | ICD-10-CM

## 2018-12-30 NOTE — Progress Notes (Signed)
  Patient was seen on 12/30/2018 for Gestational Diabetes self-management follow up to complete diabetes education. At our first visit I only provided patient with a meter and taught her how to use it. . EDD 01/15/2019. Patient speaks Arabic,her husband is here to interpret for this visit.  Patient states history of GDM with last pregnancy and states she is still on Glyburide and Metformin.  Diet history obtained today, she is eating good variety of all food groups and beverages include water, green tea with 1 tsp sugar. I noticed many of her BG numbers ended in "0" so I asked her about this. She states she is rounding the number to the closest "0'. I asked her to write the exact number and to bring her meter to future appointments.  The following learning objectives were met by the patient :   States why dietary management is important in controlling blood glucose  Describes the effects of carbohydrates on blood glucose levels  Demonstrates ability to create a balanced meal plan  Demonstrates carbohydrate counting   States when to check blood glucose levels  Demonstrates proper blood glucose monitoring techniques  States the effect of stress and exercise on blood glucose levels  States the importance of limiting caffeine and abstaining from alcohol and smoking   Plan:  Aim for 3 Carb Choices per meal (45 grams) +/- 1 either way  Aim for 1-2 Carbs per snack Begin reading food labels for Total Carbohydrate of foods If OK with your MD, consider  increasing your activity level by walking, Arm Chair Exercises or other activity daily as tolerated Continue checking BG before breakfast and 2 hours after first bite of breakfast, lunch and dinner as directed by MD  Bring Log Book/Sheet to every medical appointment   Baby Scripts:  Patient not appropriate for Baby Scripts due to language barrier  Take medication as directed by MD  Patient instructed to monitor glucose levels: FBS: 60 - 95 mg/dl 2  hour: <120 mg/dl  Patient received the following handouts: in Arabic She already has this handout at home  Patient will be seen for follow-up in 2 weeks or as needed.

## 2018-12-30 NOTE — Patient Instructions (Signed)
Return to clinic for any scheduled appointments or obstetric concerns, or go to MAU for evaluation  

## 2018-12-30 NOTE — Telephone Encounter (Signed)
[id number] interpreter number Preadmission screen

## 2018-12-30 NOTE — Progress Notes (Signed)

## 2018-12-30 NOTE — Progress Notes (Signed)
PRENATAL VISIT NOTE  Subjective:  Elaine Kelly is a 33 y.o. [id number] at [redacted]w[redacted]d being seen today for ongoing prenatal care.  She is currently monitored for the following issues for this high-risk pregnancy and has Obese; Lactating mother; Language barrier, cultural differences; Postpartum hemorrhage - approx 1500cc ; Positive GBS test; Thrombocytopenic disorder (HCC); Supervision of high risk pregnancy, antepartum; and Gestational diabetes mellitus treated with oral hypoglycemic therapy on their problem list. Patient speaks some English but Arabic is main language, she signed form today to have her husband (who accompanied her today) to be her Nurse, learning disability.  Patient reports no complaints.  Contractions: Not present. Vag. Bleeding: None.  Movement: Present. Denies leaking of fluid.   The following portions of the patient's history were reviewed and updated as appropriate: allergies, current medications, past family history, past medical history, past social history, past surgical history and problem list. Problem list updated.  Objective:   Vitals:   12/30/18 0935  BP: 126/76  Pulse: 93  Weight: 234 lb 11.2 oz (106.5 kg)    Fetal Status: Fetal Heart Rate (bpm): 136   Movement: Present  Presentation: Vertex  General:  Alert, oriented and cooperative. Patient is in no acute distress.  Skin: Skin is warm and dry. No rash noted.   Cardiovascular: Normal heart rate noted  Respiratory: Normal respiratory effort, no problems with respiration noted  Abdomen: Soft, gravid, appropriate for gestational age.  Pain/Pressure: Present     Pelvic: Cervical exam performed Dilation: 3 Effacement (%): 50 Station: -3  Extremities: Normal range of motion.  Edema: None  Mental Status: Normal mood and affect. Normal behavior. Normal judgment and thought content.   Imaging:  [place] Mfm Fetal Bpp W/nonstress  Result Date: 12/23/2018 ----------------------------------------------------------------------   OBSTETRICS REPORT                       (Signed Final 12/23/2018 04:41 pm) ---------------------------------------------------------------------- Patient Info  ID #:       [id number]                          D.O.B.:  Jul 31, 1986 (32 yrs)  Name:       Elaine Kelly                Visit Date: 12/23/2018 03:15 pm ---------------------------------------------------------------------- Performed By  Performed By:     Percell Boston          Ref. Address:     [facility]                    RDMS                                                             OB/Gyn Clinic                                                             [address]                                                             [place], [place]                                                             [address]  Attending:        Noralee Space MD        Location:         [facility]  Referred By:      [facility] for                    [facility]                    Healthcare ---------------------------------------------------------------------- Orders   #  Description                          Code         Ordered By   1  [place] MFM OB DETAIL +14 WK              [id number]     Luna Kitchens   2  [place] MFM FETAL BPP                     20254.2      Luna Kitchens      W/NONSTRESS  ----------------------------------------------------------------------   #  Order #                    Accession #                 Episode #   1  [id number]                  [id number]                  [id number]   2  [id number]                  [id number]                  [id number]  ---------------------------------------------------------------------- Indications   Late to prenatal care, third trimester         O09.33   Gestational diabetes in pregnancy,             O24.415   controlled by oral hypoglycemic drugs   (metformin)   Obesity complicating pregnancy, third          O99.213    trimester   Encounter for antenatal screening for          Z36.3   malformations   [redacted] weeks gestation of pregnancy                Z3A.36  ---------------------------------------------------------------------- Fetal Evaluation  Num Of Fetuses:         1  Fetal  Heart Rate(bpm):  146  Cardiac Activity:       Observed  Presentation:           Cephalic  Placenta:               Posterior  P. Cord Insertion:      Visualized, central  Amniotic Fluid  AFI FV:      Within normal limits  AFI Sum(cm)     %Tile       Largest Pocket(cm)  21.23           81          7.62  RUQ(cm)       RLQ(cm)       LUQ(cm)        LLQ(cm)  3.83          7.62          5.94           3.84 ---------------------------------------------------------------------- Biophysical Evaluation  Amniotic F.V:   Within normal limits       F. Tone:        Observed  F. Movement:    Observed                   N.S.T:          Reactive  F. Breathing:   Not Observed               Score:          8/10 ---------------------------------------------------------------------- Biometry  BPD:      88.6  mm     G. Age:  35w 6d         38  %    CI:        74.99   %    70 - 86                                                          FL/HC:      22.2   %    20.8 - 22.6  HC:      324.6  mm     G. Age:  36w 5d         24  %    HC/AC:      0.96        0.92 - 1.05  AC:      338.5  mm     G. Age:  37w 5d         86  %    FL/BPD:     81.3   %    71 - 87  FL:         72  mm     G. Age:  36w 6d         52  %    FL/AC:      21.3   %    20 - 24  HUM:      64.1  mm     G. Age:  37w 1d         77  %  Est. FW:    3145  gm    6 lb 15 oz      77  % ---------------------------------------------------------------------- OB History  Gravidity:    6  Term:   4        Prem:   0        SAB:   1  TOP:          0       Ectopic:  0        Living: 4 ---------------------------------------------------------------------- Gestational Age  LMP:           36w 5d        Date:  04/10/18                  EDD:   01/15/19  U/S Today:     36w 6d                                        EDD:   01/14/19  Best:          36w 5d     Det. By:  LMP  (04/10/18)          EDD:   01/15/19 ---------------------------------------------------------------------- Anatomy  Cranium:               Appears normal         Aortic Arch:            Appears normal  Cavum:                 Appears normal         Ductal Arch:            Not well visualized  Ventricles:            Appears normal         Diaphragm:              Appears normal  Choroid Plexus:        Not well visualized    Stomach:                Appears normal, left                                                                        sided  Cerebellum:            Appears normal         Abdomen:                Appears normal  Posterior Fossa:       Not well visualized    Abdominal Wall:         Not well visualized  Nuchal Fold:           Not applicable (>20    Cord Vessels:           Appears normal ([redacted]                         wks GA)                                        vessel cord)  Face:  Not well visualized    Kidneys:                Appear normal  Lips:                  Not well visualized    Bladder:                Appears normal  Thoracic:              Appears normal         Spine:                  Appears normal  Heart:                 Not well visualized    Upper Extremities:      Visualized  RVOT:                  Not well visualized    Lower Extremities:      Visualized  LVOT:                  Not well visualized  Other:  Technically difficult due to advanced gestational age. ---------------------------------------------------------------------- Cervix Uterus Adnexa  Cervix  Not visualized (advanced GA >24wks) ---------------------------------------------------------------------- Impression  Late prenatal care. Patient has gestational diabetes and  takes metformin.  Fetal growth is appropriate for gestational age. Amniotic fluid  is normal and good fetal  activity is seen. Cephalic  presentation. Fetal anatomy appears normal, but limited by  advanced gestational age.  Fetal breathing movements did not meet the criteria. NST is  reactive. BPP 8/10.  We reassured the patient of the findings. ---------------------------------------------------------------------- Recommendations  Continue weekly antenatal testing till delivery. ----------------------------------------------------------------------                  Noralee Spaceavi Shankar, MD Electronically Signed Final Report   12/23/2018 04:41 pm ----------------------------------------------------------------------  [place] Mfm Ob Detail +14 Wk  Result Date: 12/23/2018 ----------------------------------------------------------------------  OBSTETRICS REPORT                       (Signed Final 12/23/2018 04:41 pm) ---------------------------------------------------------------------- Patient Info  ID #:       [id number]                          D.O.B.:  08/14/1986 (32 yrs)  Name:       Elaine DeemSHAHIRA Mena                Visit Date: 12/23/2018 03:15 pm ---------------------------------------------------------------------- Performed By  Performed By:     Percell BostonHeather Waken          Ref. Address:     [facility]                    RDMS                                                             OB/Gyn Clinic  [address]                                                             [place], [place]                                                             [address]  Attending:        Noralee Space MD        Location:         [facility]  Referred By:      [facility] for                    [facility]                    Healthcare ---------------------------------------------------------------------- Orders   #  Description                          Code         Ordered By   1  [place] MFM OB  DETAIL +14 WK              [id number]     Luna Kitchens   2  [place] MFM FETAL BPP                     27782.4      Luna Kitchens      W/NONSTRESS  ----------------------------------------------------------------------   #  Order #                    Accession #                 Episode #   1  [id number]                  [id number]                  [id number]   2  [id number]                  [id number]                  [id number]  ---------------------------------------------------------------------- Indications   Late to prenatal care, third trimester         O09.33   Gestational diabetes in pregnancy,             O24.415   controlled by oral hypoglycemic drugs   (metformin)   Obesity complicating pregnancy, third          W40.973  trimester   Encounter for antenatal screening for          Z36.3   malformations   [redacted] weeks gestation of pregnancy                Z3A.36  ---------------------------------------------------------------------- Fetal Evaluation  Num Of Fetuses:         1  Fetal Heart Rate(bpm):  146  Cardiac Activity:       Observed  Presentation:           Cephalic  Placenta:               Posterior  P. Cord Insertion:      Visualized, central  Amniotic Fluid  AFI FV:      Within normal limits  AFI Sum(cm)     %Tile       Largest Pocket(cm)  21.23           81          7.62  RUQ(cm)       RLQ(cm)       LUQ(cm)        LLQ(cm)  3.83          7.62          5.94           3.84 ---------------------------------------------------------------------- Biophysical Evaluation  Amniotic F.V:   Within normal limits       F. Tone:        Observed  F. Movement:    Observed                   N.S.T:          Reactive  F. Breathing:   Not Observed               Score:          8/10 ---------------------------------------------------------------------- Biometry  BPD:      88.6  mm     G. Age:  35w 6d         38  %    CI:        74.99   %    70 - 86                                                          FL/HC:      22.2   %     20.8 - 22.6  HC:      324.6  mm     G. Age:  36w 5d         24  %    HC/AC:      0.96        0.92 - 1.05  AC:      338.5  mm     G. Age:  37w 5d         86  %    FL/BPD:     81.3   %    71 - 87  FL:         72  mm     G. Age:  36w 6d         52  %    FL/AC:      21.3   %    20 - 24  HUM:      64.1  mm     G. Age:  37w 1d         77  %  Est. FW:    3145  gm    6 lb 15 oz      77  % ---------------------------------------------------------------------- OB History  Gravidity:    6         Term:   4        Prem:   0        SAB:   1  TOP:          0       Ectopic:  0        Living: 4 ---------------------------------------------------------------------- Gestational Age  LMP:           36w 5d        Date:  04/10/18                 EDD:   01/15/19  U/S Today:     36w 6d                                        EDD:   01/14/19  Best:          36w 5d     Det. By:  LMP  (04/10/18)          EDD:   01/15/19 ---------------------------------------------------------------------- Anatomy  Cranium:               Appears normal         Aortic Arch:            Appears normal  Cavum:                 Appears normal         Ductal Arch:            Not well visualized  Ventricles:            Appears normal         Diaphragm:              Appears normal  Choroid Plexus:        Not well visualized    Stomach:                Appears normal, left                                                                        sided  Cerebellum:            Appears normal         Abdomen:                Appears normal  Posterior Fossa:       Not well visualized    Abdominal Wall:         Not well visualized  Nuchal Fold:           Not applicable (>20    Cord Vessels:           Appears normal ([redacted]  wks GA)                                        vessel cord)  Face:                  Not well visualized    Kidneys:                Appear normal  Lips:                  Not well visualized    Bladder:                Appears normal  Thoracic:               Appears normal         Spine:                  Appears normal  Heart:                 Not well visualized    Upper Extremities:      Visualized  RVOT:                  Not well visualized    Lower Extremities:      Visualized  LVOT:                  Not well visualized  Other:  Technically difficult due to advanced gestational age. ---------------------------------------------------------------------- Cervix Uterus Adnexa  Cervix  Not visualized (advanced GA >24wks) ---------------------------------------------------------------------- Impression  Late prenatal care. Patient has gestational diabetes and  takes metformin.  Fetal growth is appropriate for gestational age. Amniotic fluid  is normal and good fetal activity is seen. Cephalic  presentation. Fetal anatomy appears normal, but limited by  advanced gestational age.  Fetal breathing movements did not meet the criteria. NST is  reactive. BPP 8/10.  We reassured the patient of the findings. ---------------------------------------------------------------------- Recommendations  Continue weekly antenatal testing till delivery. ----------------------------------------------------------------------                  Noralee Space, MD Electronically Signed Final Report   12/23/2018 04:41 pm ----------------------------------------------------------------------   Assessment and Plan:  Pregnancy: [id number] at [redacted]w[redacted]d  1. Gestational diabetes mellitus treated with oral hypoglycemic therapy Had a few fastings in 100s earlier in week, but last three values have been 80-90s. Postprandials within range. Continue Metformin 1000 mg qhs. NST performed today was reviewed and was found to be reactive. Subsequent BPP performed today was also reviewed and was found to be 10/10. AFI was also normal. Normal growth scan last week (see above).  Continue recommended antenatal testing and prenatal care. IOL scheduled at 39 weeks, orders placed.  2. Positive GBS  test Discussed with patient, will need intrapartum prophylaxis.  3. Supervision of high risk pregnancy, antepartum Vaccinations given today. - Tdap vaccine greater than or equal to 7yo IM - Flu Vaccine QUAD 36+ mos IM Term labor symptoms and general obstetric precautions including but not limited to vaginal bleeding, contractions, leaking of fluid and fetal movement were reviewed in detail with the patient. Please refer to After Visit Summary for other counseling recommendations.  Return in about 1 week (around 01/06/2019) for NST, BPP, OB Visit (HOB).  Future Appointments  Date Time Provider Department Center  01/06/2019  2:15 PM [facility] NST [facility] W[facility]  01/06/2019  3:15 PM Conan Bowens, MD [facility] WOC  01/08/2019  8:00 AM [facility] SCHED ROOM [facility] None    Jaynie Collins, MD

## 2019-01-01 ENCOUNTER — Encounter (HOSPITAL_COMMUNITY): Payer: Self-pay

## 2019-01-01 ENCOUNTER — Inpatient Hospital Stay (HOSPITAL_COMMUNITY)
Admission: AD | Admit: 2019-01-01 | Discharge: 2019-01-03 | DRG: 806 | Disposition: A | Discharge status: Home / Self Care | Payer: Medicaid Other | Attending: Obstetrics & Gynecology | Admitting: Obstetrics & Gynecology

## 2019-01-01 ENCOUNTER — Other Ambulatory Visit: Payer: Self-pay

## 2019-01-01 DIAGNOSIS — D696 Thrombocytopenia, unspecified: Secondary | ICD-10-CM | POA: Diagnosis present

## 2019-01-01 DIAGNOSIS — B951 Streptococcus, group B, as the cause of diseases classified elsewhere: Secondary | ICD-10-CM | POA: Diagnosis present

## 2019-01-01 DIAGNOSIS — O99824 Streptococcus B carrier state complicating childbirth: Secondary | ICD-10-CM | POA: Diagnosis present

## 2019-01-01 DIAGNOSIS — O0990 Supervision of high risk pregnancy, unspecified, unspecified trimester: Secondary | ICD-10-CM

## 2019-01-01 DIAGNOSIS — O99214 Obesity complicating childbirth: Secondary | ICD-10-CM | POA: Diagnosis present

## 2019-01-01 DIAGNOSIS — O24424 Gestational diabetes mellitus in childbirth, insulin controlled: Secondary | ICD-10-CM | POA: Diagnosis not present

## 2019-01-01 DIAGNOSIS — O26893 Other specified pregnancy related conditions, third trimester: Secondary | ICD-10-CM | POA: Diagnosis present

## 2019-01-01 DIAGNOSIS — Z3A38 38 weeks gestation of pregnancy: Secondary | ICD-10-CM

## 2019-01-01 DIAGNOSIS — O24425 Gestational diabetes mellitus in childbirth, controlled by oral hypoglycemic drugs: Principal | ICD-10-CM | POA: Diagnosis present

## 2019-01-01 DIAGNOSIS — O9912 Other diseases of the blood and blood-forming organs and certain disorders involving the immune mechanism complicating childbirth: Secondary | ICD-10-CM | POA: Diagnosis present

## 2019-01-01 DIAGNOSIS — E669 Obesity, unspecified: Secondary | ICD-10-CM | POA: Diagnosis present

## 2019-01-01 DIAGNOSIS — O24415 Gestational diabetes mellitus in pregnancy, controlled by oral hypoglycemic drugs: Secondary | ICD-10-CM | POA: Diagnosis present

## 2019-01-01 DIAGNOSIS — O24419 Gestational diabetes mellitus in pregnancy, unspecified control: Secondary | ICD-10-CM | POA: Insufficient documentation

## 2019-01-01 DIAGNOSIS — Z603 Acculturation difficulty: Secondary | ICD-10-CM

## 2019-01-01 LAB — COMPREHENSIVE METABOLIC PANEL
ALT: 10 U/L (ref 0–44)
AST: 14 U/L — ABNORMAL LOW (ref 15–41)
Albumin: 2.6 g/dL — ABNORMAL LOW (ref 3.5–5.0)
Alkaline Phosphatase: 275 U/L — ABNORMAL HIGH (ref 38–126)
Anion gap: 10 (ref 5–15)
BUN: 5 mg/dL — ABNORMAL LOW (ref 6–20)
CO2: 20 mmol/L — ABNORMAL LOW (ref 22–32)
Calcium: 8.9 mg/dL (ref 8.9–10.3)
Chloride: 106 mmol/L (ref 98–111)
Creatinine, Ser: 0.44 mg/dL (ref 0.44–1.00)
GFR calc non Af Amer: >60 mL/min (ref >60)
Glucose, Bld: 89 mg/dL (ref 70–99)
Potassium: 4.3 mmol/L (ref 3.5–5.1)
Sodium: 136 mmol/L (ref 135–145)
Total Bilirubin: 0.3 mg/dL (ref 0.3–1.2)
Total Protein: 5.9 g/dL — ABNORMAL LOW (ref 6.5–8.1)

## 2019-01-01 LAB — ABO/RH: ABO/RH(D): A POS

## 2019-01-01 LAB — CBC
HCT: 38.7 % (ref 36.0–46.0)
Hemoglobin: 12.5 g/dL (ref 12.0–15.0)
MCH: 26.7 pg (ref 26.0–34.0)
MCHC: 32.3 g/dL (ref 30.0–36.0)
MCV: 82.7 fL (ref 80.0–100.0)
PLATELETS: 167 10*3/uL (ref 150–400)
RBC: 4.68 MIL/uL (ref 3.87–5.11)
RDW: 13.8 % (ref 11.5–15.5)
WBC: 8.1 10*3/uL (ref 4.0–10.5)
nRBC: 0 % (ref 0.0–0.2)

## 2019-01-01 LAB — GLUCOSE, CAPILLARY: Glucose-Capillary: 83 mg/dL (ref 70–99)

## 2019-01-01 LAB — POCT FERN TEST: POCT Fern Test: NEGATIVE

## 2019-01-01 MED ORDER — ZOLPIDEM TARTRATE 5 MG PO TABS: 5.0000 mg | ORAL_TABLET | Freq: Every evening | ORAL | Status: DC | PRN

## 2019-01-01 MED ORDER — WITCH HAZEL-GLYCERIN EX PADS: 1.0000 "application " | MEDICATED_PAD | CUTANEOUS | Status: DC | PRN

## 2019-01-01 MED ORDER — TERBUTALINE SULFATE 1 MG/ML IJ SOLN: 0.2500 mg | Freq: Once | INTRAMUSCULAR | Status: DC | PRN

## 2019-01-01 MED ORDER — DIPHENHYDRAMINE HCL 25 MG PO CAPS: 25.0000 mg | ORAL_CAPSULE | Freq: Four times a day (QID) | ORAL | Status: DC | PRN

## 2019-01-01 MED ORDER — OXYTOCIN 40 UNITS IN NORMAL SALINE INFUSION - SIMPLE MED
1.0000 m[IU]/min | INTRAVENOUS | Status: DC
  Filled 2019-01-01: qty 1000

## 2019-01-01 MED ORDER — PRENATAL MULTIVITAMIN CH
1.0000 | ORAL_TABLET | Freq: Every day | ORAL | Status: DC
  Administered 2019-01-03: 1 via ORAL
  Filled 2019-01-01 (×3): qty 1

## 2019-01-01 MED ORDER — OXYCODONE-ACETAMINOPHEN 5-325 MG PO TABS
1.0000 | ORAL_TABLET | ORAL | Status: DC | PRN
  Administered 2019-01-01: 1 via ORAL
  Filled 2019-01-01: qty 1

## 2019-01-01 MED ORDER — ONDANSETRON HCL 4 MG/2ML IJ SOLN: 4.0000 mg | INTRAMUSCULAR | Status: DC | PRN

## 2019-01-01 MED ORDER — FENTANYL CITRATE (PF) 100 MCG/2ML IJ SOLN: 50.0000 ug | INTRAMUSCULAR | Status: DC | PRN

## 2019-01-01 MED ORDER — TETANUS-DIPHTH-ACELL PERTUSSIS 5-2.5-18.5 LF-MCG/0.5 IM SUSP: 0.5000 mL | Freq: Once | INTRAMUSCULAR | Status: AC

## 2019-01-01 MED ORDER — LACTATED RINGERS IV SOLN: 500.0000 mL | INTRAVENOUS | Status: DC | PRN

## 2019-01-01 MED ORDER — HYDROXYZINE HCL 50 MG PO TABS: 50.0000 mg | ORAL_TABLET | Freq: Four times a day (QID) | ORAL | Status: DC | PRN

## 2019-01-01 MED ORDER — OXYTOCIN 40 UNITS IN NORMAL SALINE INFUSION - SIMPLE MED
2.5000 [IU]/h | INTRAVENOUS | Status: DC
  Administered 2019-01-01: 2.5 [IU]/h via INTRAVENOUS

## 2019-01-01 MED ORDER — SENNOSIDES-DOCUSATE SODIUM 8.6-50 MG PO TABS
2.0000 | ORAL_TABLET | ORAL | Status: DC
  Administered 2019-01-01 – 2019-01-02 (×2): 2 via ORAL
  Filled 2019-01-01 (×2): qty 2

## 2019-01-01 MED ORDER — SOD CITRATE-CITRIC ACID 500-334 MG/5ML PO SOLN: 30.0000 mL | ORAL | Status: DC | PRN

## 2019-01-01 MED ORDER — BENZOCAINE-MENTHOL 20-0.5 % EX AERO
1.0000 "application " | INHALATION_SPRAY | CUTANEOUS | Status: DC | PRN
  Filled 2019-01-01: qty 56

## 2019-01-01 MED ORDER — FLEET ENEMA 7-19 GM/118ML RE ENEM: 1.0000 | ENEMA | Freq: Every day | RECTAL | Status: DC | PRN

## 2019-01-01 MED ORDER — LIDOCAINE HCL (PF) 1 % IJ SOLN: 30.0000 mL | INTRAMUSCULAR | Status: DC | PRN

## 2019-01-01 MED ORDER — COCONUT OIL OIL: 1.0000 "application " | TOPICAL_OIL | Status: DC | PRN

## 2019-01-01 MED ORDER — SODIUM CHLORIDE 0.9 % IV SOLN
5.0000 10*6.[IU] | Freq: Once | INTRAVENOUS | Status: AC
  Administered 2019-01-01: 5 10*6.[IU] via INTRAVENOUS
  Filled 2019-01-01: qty 5

## 2019-01-01 MED ORDER — MISOPROSTOL 25 MCG QUARTER TABLET: 25.0000 ug | ORAL_TABLET | ORAL | Status: DC | PRN

## 2019-01-01 MED ORDER — DIBUCAINE 1 % RE OINT: 1.0000 "application " | TOPICAL_OINTMENT | RECTAL | Status: DC | PRN

## 2019-01-01 MED ORDER — ACETAMINOPHEN 325 MG PO TABS: 650.0000 mg | ORAL_TABLET | ORAL | Status: DC | PRN

## 2019-01-01 MED ORDER — IBUPROFEN 600 MG PO TABS
600.0000 mg | ORAL_TABLET | Freq: Four times a day (QID) | ORAL | Status: DC
  Administered 2019-01-01 – 2019-01-03 (×7): 600 mg via ORAL
  Filled 2019-01-01 (×8): qty 1

## 2019-01-01 MED ORDER — ONDANSETRON HCL 4 MG PO TABS: 4.0000 mg | ORAL_TABLET | ORAL | Status: DC | PRN

## 2019-01-01 MED ORDER — LACTATED RINGERS IV SOLN
INTRAVENOUS | Status: DC
  Administered 2019-01-01: 10:00:00 via INTRAVENOUS

## 2019-01-01 MED ORDER — OXYTOCIN BOLUS FROM INFUSION
500.0000 mL | Freq: Once | INTRAVENOUS | Status: AC
  Administered 2019-01-01: 500 mL via INTRAVENOUS

## 2019-01-01 MED ORDER — ACETAMINOPHEN 325 MG PO TABS
650.0000 mg | ORAL_TABLET | ORAL | Status: DC | PRN
  Administered 2019-01-01 – 2019-01-03 (×3): 650 mg via ORAL
  Filled 2019-01-01 (×3): qty 2

## 2019-01-01 MED ORDER — ONDANSETRON HCL 4 MG/2ML IJ SOLN: 4.0000 mg | Freq: Four times a day (QID) | INTRAMUSCULAR | Status: DC | PRN

## 2019-01-01 MED ORDER — SIMETHICONE 80 MG PO CHEW: 80.0000 mg | CHEWABLE_TABLET | ORAL | Status: DC | PRN

## 2019-01-01 MED ORDER — OXYCODONE-ACETAMINOPHEN 5-325 MG PO TABS: 2.0000 | ORAL_TABLET | ORAL | Status: DC | PRN

## 2019-01-01 MED ORDER — PENICILLIN G 3 MILLION UNITS IVPB - SIMPLE MED: 3.0000 10*6.[IU] | INTRAVENOUS | Status: DC

## 2019-01-01 MED ORDER — OXYCODONE-ACETAMINOPHEN 5-325 MG PO TABS: 1.0000 | ORAL_TABLET | ORAL | Status: DC | PRN

## 2019-01-01 NOTE — MAU Note (Signed)
Pt ctx started at 1am, feeling 5 min apart now. Having bleeding with mucous. No leaking fluid, 3cm at office. +FM

## 2019-01-01 NOTE — Anesthesia Pain Management Evaluation Note (Signed)
  CRNA Pain Management Visit Note  Patient: Elaine Kelly, 33 y.o., female  "Hello I am a member of the anesthesia team at [facility]. We have an anesthesia team available at all times to provide care throughout the hospital, including epidural management and anesthesia for C-section. I don't know your plan for the delivery whether it a natural birth, water birth, IV sedation, nitrous supplementation, doula or epidural, but we want to meet your pain goals."   1.Was your pain managed to your expectations on prior hospitalizations?   Yes   2.What is your expectation for pain management during this hospitalization?     Epidural  3.How can we help you reach that goal? *support**  Record the patient's initial score and the patient's pain goal.   Pain: 4  Pain Goal: 8 [facility] wants you to be able to say your pain was always managed very well.  Trellis Paganini 01/01/2019

## 2019-01-01 NOTE — Discharge Summary (Signed)
Postpartum Discharge Summary    Patient Name: Elaine Kelly DOB: 25-Mar-1986 MRN: [id number]  Date of admission: 01/01/2019 Delivering Provider: Thressa Sheller D   Date of discharge: 01/03/2019  Admitting diagnosis: CTX Intrauterine pregnancy: [redacted]w[redacted]d     Secondary diagnosis:  Active Problems:   Obese   Language barrier, cultural differences   Postpartum hemorrhage - approx 1500cc    Positive GBS test   Thrombocytopenic disorder (HCC)   Gestational diabetes mellitus treated with oral hypoglycemic therapy   Discharge diagnosis: Term Pregnancy Delivered and GDM A2                                  Post partum procedures:None Augmentation: None Complications: None  Hospital course:  Onset of Labor With Vaginal Delivery     33 y.o. yo [id number] at [redacted]w[redacted]d was admitted in Active Labor on 01/01/2019. Patient had an uncomplicated labor course as follows: Membrane Rupture Time/Date: 11:40 AM ,01/01/2019 . Lacerations:  1st degree [2] . Patient had a delivery of a Viable infant. Patient had an uncomplicated postpartum course.  She is ambulating, tolerating a regular diet, passing flatus, and urinating well. Reports milk has not come in completely yet, but is still breastfeeding regularly. Requesting depo prior to discharge, has history of IUD expulsion. Patient is discharged home in stable condition on 01/03/19.  Magnesium Sulfate recieved: No BMZ received: No  Physical exam  Vitals:   01/02/19 0545 01/02/19 1524 01/02/19 2100 01/03/19 0534  BP: 94/63 118/66 111/66 127/79  Pulse: 83 90 80 82  Resp: 18 15 16 18   Temp: 98.2 F (36.8 C) 98.4 F (36.9 C) 98 F (36.7 C) 97.7 F (36.5 C)  TempSrc: Oral Oral Oral Oral  SpO2:   99%   Weight:      Height:       General: alert, well-appearing, NAD Lochia: appropriate Uterine Fundus: firm Incision: N/A DVT Evaluation: No significant calf/ankle edema  Labs: Lab Results  Component Value Date   WBC 8.1 01/01/2019   HGB 12.5 01/01/2019   HCT  38.7 01/01/2019   MCV 82.7 01/01/2019   PLT 167 01/01/2019   CMP Latest Ref Rng & Units 01/01/2019  Glucose 70 - 99 mg/dL 89  BUN 6 - 20 mg/dL 5(L)  Creatinine 6.25 - 1.00 mg/dL 6.38  Sodium 937 - 342 mmol/L 136  Potassium 3.5 - 5.1 mmol/L 4.3  Chloride 98 - 111 mmol/L 106  CO2 22 - 32 mmol/L 20(L)  Calcium 8.9 - 10.3 mg/dL 8.9  Total Protein 6.5 - 8.1 g/dL 5.9(L)  Total Bilirubin 0.3 - 1.2 mg/dL 0.3  Alkaline Phos 38 - 126 U/L 275(H)  AST 15 - 41 U/L 14(L)  ALT 0 - 44 U/L 10    Discharge instruction: per After Visit Summary and "Baby and Me Booklet".  After visit meds:  Allergies as of 01/03/2019   No Known Allergies     Medication List    STOP taking these medications   ACCU-CHEK FASTCLIX LANCETS Misc   glucose blood test strip Commonly known as:  ACCU-CHEK GUIDE   metFORMIN 1000 MG tablet Commonly known as:  GLUCOPHAGE   naproxen 375 MG tablet Commonly known as:  NAPROSYN     TAKE these medications   ibuprofen 800 MG tablet Commonly known as:  ADVIL,MOTRIN Take 1 tablet (800 mg total) by mouth 3 (three) times daily. What changed:    medication strength  how much  to take  when to take this  reasons to take this   PNV PO Take by mouth.   senna-docusate 8.6-50 MG tablet Commonly known as:  Senokot-S Take 2 tablets by mouth at bedtime as needed for mild constipation.      Diet: routine diet Activity: Advance as tolerated. Pelvic rest for 6 weeks.   Outpatient follow up:6 weeks Follow up Appt: Future Appointments  Date Time Provider Department Center  01/06/2019  2:15 PM [facility]  01/06/2019  3:15 PM Conan Bowens, MD [facility]   Follow up Visit: Follow-up Information    [facility] for [facility]. Schedule an appointment as soon as possible for a visit.   Specialty:  Obstetrics and Gynecology Why:  You should receive a call to schedule your postpartum visit. If you do not, please call clinic to make a postpartum  visit in 4-6 weeks.  Contact information: [address] [place] [address] [phone]         Please schedule this patient for Postpartum visit in: 6 weeks with the following provider: Any provider For C/S patients schedule nurse incision check in weeks 2 weeks: no High risk pregnancy complicated by: GDM Delivery mode:  SVD Anticipated Birth Control:  other/unsure PP Procedures needed: 2 hour GTT  Schedule Integrated BH visit: no  Newborn Data: Live born female  Birth Weight:   APGAR: 9, 9  Newborn Delivery   Birth date/time:  01/01/2019 11:44:00 Delivery type:  Vaginal, Spontaneous    Baby Feeding: Breast Disposition:home with mother  Tamera Stands , DO 01/03/19  11:24 AM

## 2019-01-01 NOTE — H&P (Addendum)
HISTORY AND PHYSICAL  Elaine Kelly is a 33 y.o. female [phone] with IUP at [redacted]w[redacted]d by ultrasound presenting for SOL and SROM.   Reports fetal movement. Denies vaginal bleeding. Pt denies pain at this time although is contracting ever 4-5 mins.   She received her prenatal care at [facility].  Support person in labor: Husband  Ultrasounds . Anatomy U/S: Normal  Prenatal History/Complications: Marland Kitchen GDM  Past Medical History: Past Medical History:  Diagnosis Date  . Diabetes mellitus   . Gestational diabetes     Past Surgical History: Past Surgical History:  Procedure Laterality Date  . NO PAST SURGERIES      Obstetrical History: OB History    Gravida  6   Para  4   Term  4   Preterm  0   AB  1   Living  4     SAB  1   TAB  0   Ectopic  0   Multiple      Live Births  4           Social History: Social History   Socioeconomic History  . Marital status: Married    Spouse name: Not on file  . Number of children: Not on file  . Years of education: Not on file  . Highest education level: Not on file  Occupational History  . Not on file  Social Needs  . Financial resource strain: Not on file  . Food insecurity:    Worry: Not on file    Inability: Not on file  . Transportation needs:    Medical: Not on file    Non-medical: Not on file  Tobacco Use  . Smoking status: Never Smoker  . Smokeless tobacco: Never Used  Substance and Sexual Activity  . Alcohol use: No  . Drug use: No  . Sexual activity: Yes  Lifestyle  . Physical activity:    Days per week: Not on file    Minutes per session: Not on file  . Stress: Not on file  Relationships  . Social connections:    Talks on phone: Not on file    Gets together: Not on file    Attends religious service: Not on file    Active member of club or organization: Not on file    Attends meetings of clubs or organizations: Not on file    Relationship status: Not on file  Other Topics Concern  . Not on  file  Social History Narrative   ** Merged History Encounter **        Family History: Family History  Problem Relation Age of Onset  . Hypertension Father   . Cancer Maternal Aunt        leaukemia  . Diabetes Paternal Grandmother     Allergies: No Known Allergies  Medications Prior to Admission  Medication Sig Dispense Refill Last Dose  . ACCU-CHEK FASTCLIX LANCETS MISC 1 Device by Percutaneous route 4 (four) times daily. 100 each 12 Taking  . glucose blood (ACCU-CHEK GUIDE) test strip Use as instructed 100 each 12 Taking  . ibuprofen (ADVIL,MOTRIN) 200 MG tablet Take 400 mg by mouth every 6 (six) hours as needed for moderate pain.   Not Taking  . metFORMIN (GLUCOPHAGE) 1000 MG tablet Take 1 tablet (1,000 mg total) by mouth daily with supper. 60 tablet 1 Taking  . naproxen (NAPROSYN) 375 MG tablet Take 1 tablet (375 mg total) by mouth 2 (two) times daily. (Patient not taking: Reported  on 12/21/2018) 20 tablet 0 Not Taking  . Prenatal Vit w/Fe-Methylfol-FA (PNV PO) Take by mouth.   Taking     Review of Systems  All systems reviewed and negative except as stated in HPI  Blood pressure 115/76, pulse 96, temperature 98.5 F (36.9 C), temperature source Oral, resp. rate 18, weight 109.4 kg, last menstrual period 04/10/2018, SpO2 100 %, unknown if currently breastfeeding. General appearance: alert, cooperative and no distress Lungs: no respiratory distress Heart: regular rate  Abdomen: soft, non-tender; gravid  Extremities: Homans sign is negative, no sign of DVT Presentation: cephalic Fetal monitoring: cat 1- FHR 135, +accels, no decels Uterine activity: contracting every 4-5 mins Dilation: 6.5 Effacement (%): 70 Station: -3 Exam by:: jamilla cnm  Prenatal labs: ABO, Rh: A/Positive/-- (02/18 1034) Antibody: Negative (02/18 1034) Rubella: 16.10 (02/18 1034) RPR: Non Reactive (02/18 1034)  HBsAg: Negative (02/18 1034)  HIV: Non Reactive (02/18 1034)  GBS:   Positive Glucola: GDM Genetic screening: too late  Prenatal Transfer Tool  Maternal Diabetes: Yes:  Diabetes Type:  Insulin/Medication controlled Genetic Screening: Normal Maternal Ultrasounds/Referrals: Normal Fetal Ultrasounds or other Referrals:  None Maternal Substance Abuse:  No Significant Maternal Medications:  None Significant Maternal Lab Results: None  Results for orders placed or performed during the hospital encounter of 01/01/19 (from the past 24 hour(s))  Fern Test   Collection Time: 01/01/19  9:59 AM  Result Value Ref Range   POCT Fern Test Negative = intact amniotic membranes     Patient Active Problem List   Diagnosis Date Noted  . Gestational diabetes mellitus, antepartum 01/01/2019  . Thrombocytopenic disorder (HCC) 12/21/2018  . Supervision of high risk pregnancy, antepartum 12/21/2018  . Gestational diabetes mellitus treated with oral hypoglycemic therapy 12/21/2018  . Postpartum hemorrhage - approx 1500cc  01/01/2014  . Positive GBS test 01/01/2014  . Lactating mother 11/30/2011  . Language barrier, cultural differences 11/30/2011  . Obese 11/25/2011    Assessment/Plan:  Elaine Kelly is a 33 y.o. [id number] at [redacted]w[redacted]d here for SOL/SROM.   Labor:  --Expectant management --PCN for +GBS. --CBG every 2 hours -- pain control: none  Fetal Wellbeing:  -- Cephalic by SVE.  -- GBS positive -- continuous fetal monitoring -   Postpartum Planning -- breast  Camelia Eng, SNM   I confirm that I have verified the information documented in the nurse midwife student's note and that I have also personally reperformed the history, physical exam and all medical decision making activities of this service and have verified that all service and findings are accurately documented in this student's note.   Thressa Sheller DNP, CNM  01/01/19  10:33 AM

## 2019-01-02 LAB — TYPE AND SCREEN
ABO/RH(D): A POS
Antibody Screen: NEGATIVE

## 2019-01-02 NOTE — Progress Notes (Signed)
Post Partum Day 1 Subjective: no complaints, up ad lib, tolerating PO, + flatus and did Not get but one dose PCN, baby to be released Monday  Objective: Blood pressure 94/63, pulse 83, temperature 98.2 F (36.8 C), temperature source Oral, resp. rate 18, height 5\' 5"  (1.651 m), weight 109.4 kg, last menstrual period 04/10/2018, SpO2 100 %, unknown if currently breastfeeding.  Physical Exam:  General: alert, cooperative and no distress Lochia: appropriate Uterine Fundus: firm Incision: healing well DVT Evaluation: No evidence of DVT seen on physical exam.  Recent Labs    01/01/19 0950  HGB 12.5  HCT 38.7    Assessment/Plan: Plan for discharge tomorrow, Breastfeeding and Contraception History of expelled IUD after last pregnancy, now considering OCP, POP while breast feeding. Had further questions re: Depo.which she may consider.   LOS: 1 day   Tilda Burrow 01/02/2019, 9:47 AM

## 2019-01-02 NOTE — Progress Notes (Signed)
Post Partum Day 1 Subjective: baby only received 1 dose PCN for GBS + so will stay til a.m. no complaints, up ad lib, voiding and tolerating PO  Objective: Blood pressure 94/63, pulse 83, temperature 98.2 F (36.8 C), temperature source Oral, resp. rate 18, height 5\' 5"  (1.651 m), weight 109.4 kg, last menstrual period 04/10/2018, SpO2 100 %, unknown if currently breastfeeding.  Physical Exam:  General: alert, cooperative and no distress Lochia: appropriate Uterine Fundus: firm Incision:  DVT Evaluation: No evidence of DVT seen on physical exam. Negative Homan's sign.  Recent Labs    01/01/19 0950  HGB 12.5  HCT 38.7    Assessment/Plan: Plan for discharge tomorrow due to baby GBS + and inadequate antibiotic coverage.   LOS: 1 day   Tilda Burrow 01/02/2019, 9:05 AM

## 2019-01-02 NOTE — Lactation Note (Signed)
This note was copied from a baby's chart. Lactation Consultation Note  Patient Name: Elaine Kelly [id number] Date: 01/02/2019   RN to provide a halal formula instead of Gerber.   Lurline Hare [facility] 01/02/2019, 1:24 PM

## 2019-01-02 NOTE — Lactation Note (Signed)
This note was copied from a baby's chart. Lactation Consultation Note  Patient Name: Elaine Kelly [id number] Date: 01/02/2019 Reason for consult: Initial assessment;Early term 37-38.6wks P5, 14 hour female infant, Per mom, infant had 4 voids and 2 stools since delivery. Mom is experienced at breastfeeding she breastfeed her other four children for 7 to 8 months. Mom's feeding choice at admission is breast and formula feeding. Mom receives [facility] [place] and has breast pump at home.  Mom knows how to hand express and colostrum is present both breast. Mom had given infant 20 ml of gerber gentle with iron 20 kcal 1 hour prior to [facility] entering the room. Infant was cuing so mom, latched infant using the football hold on right breast, infant latched well breastfeed only for 5 minutes then fell asleep.  LC discussed with mom feeding plans. Mom decided breastfeed infant first and then supplement with formula if infant is still cuing to feed to help establish mom's milk supply. Mom will BF according hunger cues, 8 or more times within 24 hours. LC discussed I & O. Reviewed Baby & Me book's Breastfeeding Basics.  Mom knows to ask Nurse or LC if she has any questions, concerns or need further assistance with latching infant to breast.  Mom made aware of O/P services, breastfeeding support groups, community resources, and our phone # for post-discharge questions.  Maternal Data Formula Feeding for Exclusion: No Has patient been taught Hand Expression?: Yes Does the patient have breastfeeding experience prior to this delivery?: Yes  Feeding Feeding Type: Breast Fed Nipple Type: Slow - flow  LATCH Score Latch: Too sleepy or reluctant, no latch achieved, no sucking elicited.  Audible Swallowing: Spontaneous and intermittent  Type of Nipple: Everted at rest and after stimulation  Comfort (Breast/Nipple): Soft / non-tender  Hold (Positioning): Assistance needed to correctly position  infant at breast and maintain latch.  LATCH Score: 7  Interventions Interventions: Breast feeding basics reviewed;Assisted with latch;Skin to skin;Breast massage;Hand express;Support pillows;Adjust position;Breast compression  Lactation Tools Discussed/Used WIC Program: Yes   Consult Status Consult Status: Follow-up Date: 01/03/19 Follow-up type: In-patient    Danelle Earthly 01/02/2019, 2:23 AM

## 2019-01-03 MED ORDER — IBUPROFEN 800 MG PO TABS: 800.0000 mg | ORAL_TABLET | Freq: Three times a day (TID) | ORAL | 0 refills | Status: DC

## 2019-01-03 MED ORDER — SENNOSIDES-DOCUSATE SODIUM 8.6-50 MG PO TABS: 2.0000 | ORAL_TABLET | Freq: Every evening | ORAL | 0 refills | Status: DC | PRN

## 2019-01-03 MED ORDER — MEDROXYPROGESTERONE ACETATE 150 MG/ML IM SUSP
150.0000 mg | Freq: Once | INTRAMUSCULAR | Status: AC
  Administered 2019-01-03: 150 mg via INTRAMUSCULAR
  Filled 2019-01-03: qty 1

## 2019-01-03 NOTE — Progress Notes (Signed)
CSW attempted to meet with MOB to complete assessment. CSW informed MOB had already been discharged.   Archie Balboa, LCSWA  [facility] [phone]

## 2019-01-06 ENCOUNTER — Other Ambulatory Visit: Payer: Medicaid Other

## 2019-01-06 ENCOUNTER — Encounter: Payer: Medicaid Other | Admitting: Obstetrics and Gynecology

## 2019-01-06 LAB — RPR: RPR Ser Ql: NONREACTIVE

## 2019-01-08 ENCOUNTER — Inpatient Hospital Stay (HOSPITAL_COMMUNITY): Payer: Medicaid Other

## 2019-01-21 ENCOUNTER — Encounter: Payer: Self-pay | Admitting: *Deleted

## 2019-01-24 ENCOUNTER — Encounter: Payer: Self-pay | Admitting: *Deleted

## 2019-02-03 ENCOUNTER — Other Ambulatory Visit: Payer: Self-pay | Admitting: *Deleted

## 2019-02-03 DIAGNOSIS — Z8632 Personal history of gestational diabetes: Secondary | ICD-10-CM

## 2019-02-07 ENCOUNTER — Other Ambulatory Visit: Payer: Medicaid Other

## 2019-02-07 ENCOUNTER — Ambulatory Visit: Payer: Medicaid Other | Admitting: Advanced Practice Midwife

## 2019-02-08 ENCOUNTER — Encounter: Payer: Self-pay | Admitting: *Deleted

## 2019-04-25 ENCOUNTER — Other Ambulatory Visit: Payer: Self-pay

## 2019-04-25 ENCOUNTER — Ambulatory Visit (HOSPITAL_COMMUNITY)
Admission: EM | Admit: 2019-04-25 | Discharge: 2019-04-25 | Disposition: A | Discharge status: Home / Self Care | Payer: Medicaid Other | Attending: Family Medicine | Admitting: Family Medicine

## 2019-04-25 ENCOUNTER — Encounter (HOSPITAL_COMMUNITY): Payer: Self-pay | Admitting: Emergency Medicine

## 2019-04-25 DIAGNOSIS — L03012 Cellulitis of left finger: Secondary | ICD-10-CM

## 2019-04-25 MED ORDER — IBUPROFEN 800 MG PO TABS: 800.0000 mg | ORAL_TABLET | Freq: Three times a day (TID) | ORAL | 0 refills | Status: DC

## 2019-04-25 MED ORDER — CEPHALEXIN 500 MG PO CAPS: 500.0000 mg | ORAL_CAPSULE | Freq: Four times a day (QID) | ORAL | 0 refills | Status: AC

## 2019-04-25 NOTE — ED Triage Notes (Signed)
Pt here for left index finger pain and redness around cuticle

## 2019-04-25 NOTE — ED Provider Notes (Signed)
[facility]    CSN: [id number] Arrival date & time: 04/25/19  1426      History   Chief Complaint Chief Complaint  Patient presents with  . Hand Pain    HPI Arabic interpretation via [employer] interpreter Elaine Kelly is a 33 y.o. female history of DM type II, presenting today for evaluation of left index finger pain and swelling.  Patient states that for the past 6 months she has had discomfort in this finger not related this to an ingrown nail.  She notes that her family has recurrent issues with ingrown nails.  Over the past couple days she has had significant increase in pain and is noticed some swelling around her nail bed.  She denies any drainage.  Denies history of similar.  She has taken some Tylenol without relief.  Denies any known fevers.  Denies difficulty bending finger although does elicit pain with ranging.  Denies any injury.  HPI  Past Medical History:  Diagnosis Date  . Diabetes mellitus   . Gestational diabetes     Patient Active Problem List   Diagnosis Date Noted  . Gestational diabetes mellitus, antepartum 01/01/2019  . Thrombocytopenic disorder (HCC) 12/21/2018  . Supervision of high risk pregnancy, antepartum 12/21/2018  . Gestational diabetes mellitus treated with oral hypoglycemic therapy 12/21/2018  . Postpartum hemorrhage - approx 1500cc  01/01/2014  . Positive GBS test 01/01/2014  . Lactating mother 11/30/2011  . Language barrier, cultural differences 11/30/2011  . Obese 11/25/2011    Past Surgical History:  Procedure Laterality Date  . NO PAST SURGERIES      OB History    Gravida  6   Para  5   Term  5   Preterm  0   AB  1   Living  5     SAB  1   TAB  0   Ectopic  0   Multiple  0   Live Births  5            Home Medications    Prior to Admission medications   Medication Sig Start Date End Date Taking? Authorizing Provider  cephALEXin (KEFLEX) 500 MG capsule Take 1 capsule (500 mg total) by  mouth 4 (four) times daily for 7 days. 04/25/19 05/02/19  Leevi Cullars C, PA-C  ibuprofen (ADVIL) 800 MG tablet Take 1 tablet (800 mg total) by mouth 3 (three) times daily. 04/25/19   Mckaylin Bastien C, PA-C  Prenatal Vit w/Fe-Methylfol-FA (PNV PO) Take by mouth.    [provider]  senna-docusate (SENOKOT-S) 8.6-50 MG tablet Take 2 tablets by mouth at bedtime as needed for mild constipation. 01/03/19   Tamera StandsWallace, Laurel S, DO    Family History Family History  Problem Relation Age of Onset  . Hypertension Father   . Cancer Maternal Aunt        leaukemia  . Diabetes Paternal Grandmother     Social History Social History   Tobacco Use  . Smoking status: Never Smoker  . Smokeless tobacco: Never Used  Substance Use Topics  . Alcohol use: No  . Drug use: No     Allergies   Patient has no known allergies.   Review of Systems Review of Systems  Constitutional: Negative for fatigue and fever.  Eyes: Negative for visual disturbance.  Respiratory: Negative for shortness of breath.   Cardiovascular: Negative for chest pain.  Gastrointestinal: Negative for abdominal pain, nausea and vomiting.  Musculoskeletal: Positive for arthralgias and joint  swelling.  Skin: Positive for color change. Negative for rash and wound.  Neurological: Negative for dizziness, weakness, light-headedness and headaches.     Physical Exam Triage Vital Signs ED Triage Vitals [04/25/19 1456]  Enc Vitals Group     BP 138/87     Pulse Rate (!) 102     Resp 18     Temp 98.4 F (36.9 C)     Temp Source Oral     SpO2 100 %     Weight      Height      Head Circumference      Peak Flow      Pain Score 5     Pain Loc      Pain Edu?      Excl. in GC?    No data found.  Updated Vital Signs BP 138/87 (BP Location: Right Arm)   Pulse (!) 102   Temp 98.4 F (36.9 C) (Oral)   Resp 18   SpO2 100%   Visual Acuity Right Eye Distance:   Left Eye Distance:   Bilateral Distance:    Right Eye  Near:   Left Eye Near:    Bilateral Near:     Physical Exam Vitals signs and nursing note reviewed.  Constitutional:      Appearance: She is well-developed.     Comments: No acute distress  HENT:     Head: Normocephalic and atraumatic.     Nose: Nose normal.  Eyes:     Conjunctiva/sclera: Conjunctivae normal.  Neck:     Musculoskeletal: Neck supple.  Cardiovascular:     Rate and Rhythm: Normal rate.  Pulmonary:     Effort: Pulmonary effort is normal. No respiratory distress.  Abdominal:     General: There is no distension.  Musculoskeletal: Normal range of motion.     Comments: Relatively full active range of motion of left index finger at DIP and PIP, nontender over palpation of DIP and PIP, tender to palpation along medial nail fold  Skin:    General: Skin is warm and dry.     Comments: Mild erythema and swelling noted to medial nail fold of left index finger, faint white discoloration for possible pus, does not extend to distal finger pad.  Neurological:     Mental Status: She is alert and oriented to person, place, and time.      UC Treatments / Results  Labs (all labs ordered are listed, but only abnormal results are displayed) Labs Reviewed - No data to display  EKG None  Radiology No results found.  Procedures Procedures (including critical care time) I&D of paronychia attempted.  Discussed risks prior to initiation including incomplete drainage, pain, infection.  Cleaned with Betadine, allowed drying time.  11 blade scalpel inserted into medial fold of left index finger, minimally inserted to open to allow drainage, mainly bloody drainage obtained, no pustular drainage obtained.  Did not proceed as no drainage obtained and uncomfortable to patient. Medications Ordered in UC Medications - No data to display  Initial Impression / Assessment and Plan / UC Course  I have reviewed the triage vital signs and the nursing notes.  Pertinent labs & imaging results  that were available during my care of the patient were reviewed by me and considered in my medical decision making (see chart for details).     Patient appears to have paronychia of left index finger, I&D attempted without success.  Will initiate on Keflex and continue Tylenol  and ibuprofen for pain.  Monitor for resolution.  Warm soaks.Discussed strict return precautions. Patient verbalized understanding and is agreeable with plan.  Final Clinical Impressions(s) / UC Diagnoses   Final diagnoses:  Paronychia of left index finger     Discharge Instructions     Begin Keflex 4 times daily for 1 week Warm soaks at least twice daily Use anti-inflammatories for pain/swelling. You may take up to 800 mg Ibuprofen every 8 hours with food. You may supplement Ibuprofen with Tylenol [phone] mg every 8 hours.  Follow up if pain, redness and swelling not improving with the above   ED Prescriptions    Medication Sig Dispense Auth. Provider   cephALEXin (KEFLEX) 500 MG capsule Take 1 capsule (500 mg total) by mouth 4 (four) times daily for 7 days. 28 capsule Jeslynn Hollander C, PA-C   ibuprofen (ADVIL) 800 MG tablet Take 1 tablet (800 mg total) by mouth 3 (three) times daily. 21 tablet Shakiah Wester, [place] C, PA-C     Controlled Substance Prescriptions [place] Controlled Substance Registry consulted? Not Applicable   Janith Lima, [place] 04/25/19 1625

## 2019-04-25 NOTE — Discharge Instructions (Signed)
Begin Keflex 4 times daily for 1 week Warm soaks at least twice daily Use anti-inflammatories for pain/swelling. You may take up to 800 mg Ibuprofen every 8 hours with food. You may supplement Ibuprofen with Tylenol [phone] mg every 8 hours.  Follow up if pain, redness and swelling not improving with the above

## 2019-04-27 ENCOUNTER — Ambulatory Visit: Payer: Self-pay | Admitting: Family Medicine

## 2020-07-10 ENCOUNTER — Encounter: Payer: Self-pay | Admitting: *Deleted

## 2020-07-25 ENCOUNTER — Other Ambulatory Visit: Payer: Self-pay

## 2020-07-25 ENCOUNTER — Encounter: Payer: Self-pay | Admitting: *Deleted

## 2020-07-25 ENCOUNTER — Ambulatory Visit (INDEPENDENT_AMBULATORY_CARE_PROVIDER_SITE_OTHER): Payer: Medicaid Other | Admitting: *Deleted

## 2020-07-25 DIAGNOSIS — O09529 Supervision of elderly multigravida, unspecified trimester: Secondary | ICD-10-CM

## 2020-07-25 DIAGNOSIS — O169 Unspecified maternal hypertension, unspecified trimester: Secondary | ICD-10-CM

## 2020-07-25 DIAGNOSIS — O99210 Obesity complicating pregnancy, unspecified trimester: Secondary | ICD-10-CM

## 2020-07-25 DIAGNOSIS — R030 Elevated blood-pressure reading, without diagnosis of hypertension: Secondary | ICD-10-CM | POA: Insufficient documentation

## 2020-07-25 DIAGNOSIS — O0990 Supervision of high risk pregnancy, unspecified, unspecified trimester: Secondary | ICD-10-CM

## 2020-07-25 HISTORY — DX: Supervision of elderly multigravida, unspecified trimester: O09.529

## 2020-07-25 MED ORDER — BLOOD PRESSURE KIT DEVI: 1.0000 | 0 refills | Status: DC | PRN

## 2020-07-25 MED ORDER — ASPIRIN EC 81 MG PO TBEC: 81.0000 mg | DELAYED_RELEASE_TABLET | Freq: Every day | ORAL | 5 refills | Status: DC

## 2020-07-25 NOTE — Progress Notes (Signed)
New OB Intake   I explained I am completing New OB Intake today. We discussed her EDD of 12/08/20 that is based on LMP of 03/03/20. Pt is [id number]. I reviewed her allergies, medications, Medical/Surgical/OB history, and appropriate screenings. I informed her of [facility] services. Based on history, this is a/an complicated  Pregnancy with AMA, HX GDM, HX postpartum hemorrhage, HX thrombocytopenia. Today bp elevated at 132/97 and then repeat 127/87. States has been having occasional headaches last few weeks. Sometimes mild, sometimes severe- states relieved by tylenol or rest. Mild edema noted . Denies visual disturbances. Discussed with 'Dr. Alysia Penna, orders given for TSH, CMET, urine protein /creatinine ratio and to start ASA 81 mg daily. Explained to patient if she has severe headache unrelieved by tylenol or rest to go to [facility]   Concerns addressed today  Blood Pressure Cuff Blood pressure cuff ordered for patient to pick-up from [facility]. Explained after first prenatal appt pt will check weekly and document .  Anatomy US  first scheduled [place]  For asap since she is already 20 weeks.  Anatomy US scheduled . Pt notified to arrive at 15 minutes early.   Labs Discussed Avelina Laine genetic screening with patient. Would like both Panorama and Horizon drawn today with  Routine prenatal labs . Also will do urine culture, afp.   First visit review I reviewed new OB appt with pt. I explained she will have a pelvic exam, PAP smear and gc. Explained pt will be seen by Dr. Earlene Plater  at first visit; encounter routed to appropriate provider.  Audryana Hockenberry,RN 07/25/2020  1:27 PM

## 2020-07-25 NOTE — Patient Instructions (Addendum)
Address for bp cuff:   [facility]  [address] [place]   At [employer] for [employer] at [facility], we work as an integrated team, providing care to address both physical and emotional health. Your medical provider may refer you to see our Behavioral Health Clinician [facility]) on the same day you see your medical provider, as availability permits.  Our [facility] is available to all patients, visits generally last between 20-30 minutes, but can be longer or shorter, depending on patient need. The [facility] offers help with stress management, coping with symptoms of depression and anxiety, major life changes , sleep issues, changing risky behavior, grief and loss, life stress, working on personal life goals, and  behavioral health issues, as these all affect your overall health and wellness.  The [facility] is NOT available for the following: FMLA paperwork, court-ordered evaluations, specialty assessments (custody or disability), letters to employers, or obtaining certification for an emotional support animal. The [facility] does not provide long-term therapy. You have the right to refuse integrated behavioral health services, or to reschedule to see the [facility] at a later date.  Exception: If you are having thoughts of suicide, we require that you either see the [facility] for further assessment, or contract for safety with your medical provider. Confidentiality exception: If it is suspected that a child or disabled adult is being abused or neglected, we are required by law to report that to either Child Protective Services or Adult Management consultant.  If you have a diagnosis of Bipolar affective disorder, Schizophrenia, or recurrent Major depressive disorder, we will recommend that you establish care with a psychiatrist, as these are lifelong, chronic conditions, and we want your overall emotional health and medications to be more closely monitored. If you anticipate needing extended maternity leave  due to mental illness, it it recommended you inform your medical provider, so we can put in a referral to a  psychiatrist as soon as possible. The [facility] is unable to recommend an extended maternity leave for mental health issues. Your medical provider or [facility] may refer you to a therapist for ongoing, traditional therapy, or to a psychiatrist, for medication management, if it would benefit your overall health. Depending on your insurance, you may have a copay to see the [facility]. If you are uninsured, it is recommended that you apply for financial assistance. (Forms may be requested at the front desk for in-person visits, via MyChart, or request a form during a virtual visit).  If you see the [facility] more than 6 times, you will have to complete a comprehensive clinical assessment interview with the [facility] to resume integrated services.  For virtual visits with the [facility], you must be physically in the state of [place] at the time of the visit. For example, if you live in [place], you will have to do an in-person visit with the [facility]. If you are going out of the state or country for any reason, the [facility] may see you virtually when you return to [place], but not while you are physically outside of [place].

## 2020-07-26 LAB — COMPREHENSIVE METABOLIC PANEL
ALT: 7 IU/L (ref 0–32)
AST: 7 IU/L (ref 0–40)
Albumin/Globulin Ratio: 1.4 (ref 1.2–2.2)
Albumin: 3.7 g/dL — ABNORMAL LOW (ref 3.8–4.8)
Alkaline Phosphatase: 102 IU/L (ref 44–121)
BUN/Creatinine Ratio: 13 (ref 9–23)
BUN: 6 mg/dL (ref 6–20)
Bilirubin Total: <0.2 mg/dL (ref 0.0–1.2)
CO2: 21 mmol/L (ref 20–29)
Calcium: 9.4 mg/dL (ref 8.7–10.2)
Chloride: 103 mmol/L (ref 96–106)
Creatinine, Ser: 0.47 mg/dL — ABNORMAL LOW (ref 0.57–1.00)
GFR calc Af Amer: 147 mL/min/{1.73_m2} (ref >59)
GFR calc non Af Amer: 127 mL/min/{1.73_m2} (ref >59)
Globulin, Total: 2.6 g/dL (ref 1.5–4.5)
Glucose: 117 mg/dL — ABNORMAL HIGH (ref 65–99)
Potassium: 4.3 mmol/L (ref 3.5–5.2)
Sodium: 138 mmol/L (ref 134–144)
Total Protein: 6.3 g/dL (ref 6.0–8.5)

## 2020-07-26 LAB — AFP, SERUM, OPEN SPINA BIFIDA
AFP MoM: 1.46
AFP Value: 66.6 ng/mL
Gest. Age on Collection Date: 20.4 weeks
Maternal Age At EDD: 36.3 yr
OSBR Risk 1 IN: 3048
Test Results:: NEGATIVE
Weight: 246 [lb_av]

## 2020-07-26 LAB — HEMOGLOBIN A1C
Est. average glucose Bld gHb Est-mCnc: 131 mg/dL
Hgb A1c MFr Bld: 6.2 % — ABNORMAL HIGH (ref 4.8–5.6)

## 2020-07-26 LAB — CBC/D/PLT+RPR+RH+ABO+RUB AB...
Antibody Screen: NEGATIVE
Basophils Absolute: 0 10*3/uL (ref 0.0–0.2)
Basos: 1 %
EOS (ABSOLUTE): 0.1 10*3/uL (ref 0.0–0.4)
Eos: 1 %
HCV Ab: <0.1 s/co ratio (ref 0.0–0.9)
HIV Screen 4th Generation wRfx: NONREACTIVE
Hematocrit: 36.3 % (ref 34.0–46.6)
Hemoglobin: 11.9 g/dL (ref 11.1–15.9)
Hepatitis B Surface Ag: NEGATIVE
Immature Grans (Abs): 0.1 10*3/uL (ref 0.0–0.1)
Immature Granulocytes: 1 %
Lymphocytes Absolute: 2.2 10*3/uL (ref 0.7–3.1)
Lymphs: 30 %
MCH: 25.8 pg — ABNORMAL LOW (ref 26.6–33.0)
MCHC: 32.8 g/dL (ref 31.5–35.7)
MCV: 79 fL (ref 79–97)
Monocytes Absolute: 0.5 10*3/uL (ref 0.1–0.9)
Monocytes: 6 %
Neutrophils Absolute: 4.5 10*3/uL (ref 1.4–7.0)
Neutrophils: 61 %
Platelets: 220 10*3/uL (ref 150–450)
RBC: 4.61 x10E6/uL (ref 3.77–5.28)
RDW: 13.1 % (ref 11.7–15.4)
RPR Ser Ql: NONREACTIVE
Rh Factor: POSITIVE
Rubella Antibodies, IGG: 20.2 index (ref >0.99)
WBC: 7.2 10*3/uL (ref 3.4–10.8)

## 2020-07-26 LAB — HCV INTERPRETATION

## 2020-07-26 LAB — TSH: TSH: 0.675 u[IU]/mL (ref 0.450–4.500)

## 2020-07-26 LAB — PROTEIN / CREATININE RATIO, URINE
Creatinine, Urine: 125.1 mg/dL
Protein, Ur: 11.3 mg/dL
Protein/Creat Ratio: 90 mg/g creat (ref 0–200)

## 2020-07-30 ENCOUNTER — Telehealth: Payer: Self-pay | Admitting: *Deleted

## 2020-07-30 LAB — CULTURE, OB URINE

## 2020-07-30 LAB — URINE CULTURE, OB REFLEX

## 2020-07-30 NOTE — Telephone Encounter (Addendum)
-----   Message from Conan Bowens, MD sent at 07/27/2020  4:45 PM EDT ----- Needs to come in for early 2 hr GTT  9/27  1005 Called pt home # with [facility] interpreter # [phone] and her husband answered. He stated that he is @ work and Josilynn is not with him. He provided her cell # [phone]. I called that number and pt did not answer and does not have VM set up. Pt will be called back later.   9/30  1437  Pt left VM message requesting a call back. She did not state a reason for her call. I called her @ 1630 and heard message stating that the person called does not have voicemail set up. Per chart review, pt had 2hr GTT performed in office today.

## 2020-07-31 ENCOUNTER — Encounter: Payer: Self-pay | Admitting: Obstetrics and Gynecology

## 2020-07-31 DIAGNOSIS — R8271 Bacteriuria: Secondary | ICD-10-CM

## 2020-07-31 HISTORY — DX: Bacteriuria: R82.71

## 2020-07-31 NOTE — Progress Notes (Signed)
I have reviewed this chart and agree with the RN/CMA assessment and management.    K. Meryl Letesha Klecker, M.D. Attending [facility] (Faculty Practice)   

## 2020-08-02 ENCOUNTER — Other Ambulatory Visit: Payer: Self-pay | Admitting: General Practice

## 2020-08-02 ENCOUNTER — Other Ambulatory Visit: Payer: Self-pay

## 2020-08-02 ENCOUNTER — Other Ambulatory Visit: Payer: Medicaid Other

## 2020-08-02 DIAGNOSIS — O0990 Supervision of high risk pregnancy, unspecified, unspecified trimester: Secondary | ICD-10-CM

## 2020-08-03 LAB — GLUCOSE TOLERANCE, 2 HOURS W/ 1HR
Glucose, 1 hour: 239 mg/dL — ABNORMAL HIGH (ref 65–179)
Glucose, 2 hour: 207 mg/dL — ABNORMAL HIGH (ref 65–152)
Glucose, Fasting: 111 mg/dL — ABNORMAL HIGH (ref 65–91)

## 2020-08-06 ENCOUNTER — Other Ambulatory Visit (HOSPITAL_COMMUNITY)
Admission: RE | Admit: 2020-08-06 | Discharge: 2020-08-06 | Disposition: A | Discharge status: Home / Self Care | Payer: Medicaid Other | Source: Ambulatory Visit | Attending: Obstetrics and Gynecology | Admitting: Obstetrics and Gynecology

## 2020-08-06 ENCOUNTER — Other Ambulatory Visit: Payer: Self-pay

## 2020-08-06 ENCOUNTER — Encounter: Payer: Self-pay | Admitting: Obstetrics and Gynecology

## 2020-08-06 ENCOUNTER — Ambulatory Visit (INDEPENDENT_AMBULATORY_CARE_PROVIDER_SITE_OTHER): Payer: Medicaid Other | Admitting: Obstetrics and Gynecology

## 2020-08-06 VITALS — BP 125/73 | HR 102 | Wt 248.5 lb

## 2020-08-06 DIAGNOSIS — O09529 Supervision of elderly multigravida, unspecified trimester: Secondary | ICD-10-CM

## 2020-08-06 DIAGNOSIS — Z23 Encounter for immunization: Secondary | ICD-10-CM | POA: Diagnosis not present

## 2020-08-06 DIAGNOSIS — Z8759 Personal history of other complications of pregnancy, childbirth and the puerperium: Secondary | ICD-10-CM | POA: Insufficient documentation

## 2020-08-06 DIAGNOSIS — R030 Elevated blood-pressure reading, without diagnosis of hypertension: Secondary | ICD-10-CM

## 2020-08-06 DIAGNOSIS — O24419 Gestational diabetes mellitus in pregnancy, unspecified control: Secondary | ICD-10-CM | POA: Insufficient documentation

## 2020-08-06 DIAGNOSIS — R519 Headache, unspecified: Secondary | ICD-10-CM

## 2020-08-06 DIAGNOSIS — Z3A22 22 weeks gestation of pregnancy: Secondary | ICD-10-CM

## 2020-08-06 DIAGNOSIS — O99210 Obesity complicating pregnancy, unspecified trimester: Secondary | ICD-10-CM

## 2020-08-06 DIAGNOSIS — O0990 Supervision of high risk pregnancy, unspecified, unspecified trimester: Secondary | ICD-10-CM | POA: Diagnosis not present

## 2020-08-06 DIAGNOSIS — Z7189 Other specified counseling: Secondary | ICD-10-CM | POA: Diagnosis not present

## 2020-08-06 DIAGNOSIS — Z603 Acculturation difficulty: Secondary | ICD-10-CM

## 2020-08-06 DIAGNOSIS — R8271 Bacteriuria: Secondary | ICD-10-CM

## 2020-08-06 HISTORY — DX: Personal history of other complications of pregnancy, childbirth and the puerperium: Z87.59

## 2020-08-06 LAB — POCT URINALYSIS DIP (DEVICE)
Bilirubin Urine: NEGATIVE
Glucose, UA: NEGATIVE mg/dL
Hgb urine dipstick: NEGATIVE
Ketones, ur: 40 mg/dL — AB
Leukocytes,Ua: NEGATIVE
Nitrite: NEGATIVE
Protein, ur: NEGATIVE mg/dL
Specific Gravity, Urine: 1.025 (ref 1.005–1.030)
Urobilinogen, UA: 0.2 mg/dL (ref 0.0–1.0)
pH: 6.5 (ref 5.0–8.0)

## 2020-08-06 MED ORDER — BUTALBITAL-APAP-CAFFEINE 50-325-40 MG PO CAPS: 1.0000 | ORAL_CAPSULE | Freq: Four times a day (QID) | ORAL | 3 refills | Status: DC | PRN

## 2020-08-06 MED ORDER — ACCU-CHEK GUIDE W/DEVICE KIT: 1.0000 | PACK | Freq: Once | 0 refills | Status: AC

## 2020-08-06 MED ORDER — ACCU-CHEK SOFTCLIX LANCETS MISC: 12 refills | Status: DC

## 2020-08-06 MED ORDER — ACCU-CHEK GUIDE VI STRP: ORAL_STRIP | 12 refills | Status: DC

## 2020-08-06 NOTE — Progress Notes (Signed)
INITIAL PRENATAL VISIT NOTE  Subjective:  Elaine Kelly is a 34 y.o. [id number] at 28w2dby LMP being seen today for her initial prenatal visit.  She has an obstetric history significant for 5 x SVD, h/o postpartum hemorrhage, gestational diabetes. She has a medical history significant for n/a.  Patient reports headache.  Contractions: Not present. Vag. Bleeding: None.  Movement: Present. Denies leaking of fluid.   Past Medical History:  Diagnosis Date  . Diabetes mellitus   . Gestational diabetes   . Postpartum hemorrhage   . Thrombocytopenia ([place]    Past Surgical History:  Procedure Laterality Date  . NO PAST SURGERIES     OB History  Gravida Para Term Preterm AB Living  _0 0 1 5  SAB TAB Ectopic Multiple Live Births  1 0 0 0 5    # Outcome Date GA Lbr Len/2nd Weight Sex Delivery Anes PTL Lv  7 Current           6 Term 01/01/19 391w0d 00:02 7 lb 3.2 oz (3.265 kg) F Vag-Spont None  LIV     Birth Comments: GDM, PP hemorhage, thrombocytopenia  5 Term 12/31/13 3934w5d:00 / 00:30 7 lb 3.2 oz (3.266 kg) F Vag-Spont EPI  LIV     Birth Comments: wnl  4 Term 11/28/11 39w43w4d15 / 00:03 7 lb 5.3 oz (3.325 kg) F Vag-Spont EPI  LIV     Birth Comments: wnl  3 SAB 2012          2 Term 2010 38w057w0d0 7 lb (3.175 kg) M Vag-Spont None  LIV     Birth Comments: wnl  1 Term 2006 85w0d52w0d 6 lb (2.722 kg) F Vag-Spont   LIV     Birth Comments: wnl    Social History   Socioeconomic History  . Marital status: Married    Spouse name: Not on file  . Number of children: Not on file  . Years of education: Not on file  . Highest education level: Not on file  Occupational History  . Not on file  Tobacco Use  . Smoking status: Never Smoker  . Smokeless tobacco: Never Used  Vaping Use  . Vaping Use: Never used  Substance and Sexual Activity  . Alcohol use: No  . Drug use: No  . Sexual activity: Yes    Birth control/protection: None  Other Topics Concern  . Not on file    Social History Narrative   ** Merged History Encounter **       Social Determinants of Health   Financial Resource Strain:   . Difficulty of Paying Living Expenses: Not on file  Food Insecurity: No Food Insecurity  . Worried About RunninCharity fundraisere Last Year: Never true  . Ran Out of Food in the Last Year: Never true  Transportation Needs: No Transportation Needs  . Lack of Transportation (Medical): No  . Lack of Transportation (Non-Medical): No  Physical Activity:   . Days of Exercise per Week: Not on file  . Minutes of Exercise per Session: Not on file  Stress:   . Feeling of Stress : Not on file  Social Connections:   . Frequency of Communication with Friends and Family: Not on file  . Frequency of Social Gatherings with Friends and Family: Not on file  . Attends Religious Services: Not on file  . Active Member of Clubs or Organizations: Not on file  . Attends Club or  Organization Meetings: Not on file  . Marital Status: Not on file    Family History  Problem Relation Age of Onset  . Hypertension Father   . Cancer Maternal Aunt        leaukemia  . Diabetes Paternal Grandmother   . Diabetes Mother   . Hypertension Mother   . Hyperlipidemia Mother      Current Outpatient Medications:  .  acetaminophen (TYLENOL) 500 MG tablet, Take 500 mg by mouth every 6 (six) hours as needed., Disp: , Rfl:  .  Prenatal Vit w/Fe-Methylfol-FA (PNV PO), Take by mouth., Disp: , Rfl:  .  Accu-Chek Softclix Lancets lancets, Use as instructed. Please check blood glucose 4 times daily., Disp: 100 each, Rfl: 12 .  aspirin EC 81 MG tablet, Take 1 tablet (81 mg total) by mouth daily. Swallow whole. (Patient not taking: Reported on 08/06/2020), Disp: 30 tablet, Rfl: 5 .  Blood Pressure Monitoring (BLOOD PRESSURE KIT) DEVI, 1 Device by Does not apply route as needed. (Patient not taking: Reported on 08/06/2020), Disp: 1 each, Rfl: 0 .  Butalbital-APAP-Caffeine 50-325-40 MG capsule, Take  1-2 capsules by mouth every 6 (six) hours as needed for headache., Disp: 30 capsule, Rfl: 3 .  glucose blood (ACCU-CHEK GUIDE) test strip, Use as instructed. Please check blood glucose 4 times daily., Disp: 100 each, Rfl: 12  No Known Allergies  Review of Systems: Negative except for what is mentioned in HPI.  Objective:   Vitals:   08/06/20 1404  BP: 125/73  Pulse: (!) 102  Weight: 248 lb 8 oz (112.7 kg)    Fetal Status: Fetal Heart Rate (bpm): 162   Movement: Present     Physical Exam: BP 125/73   Pulse (!) 102   Wt 248 lb 8 oz (112.7 kg)   LMP 03/03/2020 (Within Days)   BMI 41.35 kg/m  CONSTITUTIONAL: Well-developed, well-nourished female in no acute distress.  NEUROLOGIC: Alert and oriented to person, place, and time. Normal reflexes, muscle tone coordination. No cranial nerve deficit noted. PSYCHIATRIC: Normal mood and affect. Normal behavior. Normal judgment and thought content. SKIN: Skin is warm and dry. No rash noted. Not diaphoretic. No erythema. No pallor. HENT:  Normocephalic, atraumatic, External right and left ear normal. Oropharynx is clear and moist EYES: Conjunctivae and EOM are normal. Pupils are equal, round, and reactive to light. No scleral icterus.  NECK: Normal range of motion, supple, no masses CARDIOVASCULAR: Normal heart rate noted, regular rhythm RESPIRATORY: Effort and breath sounds normal, no problems with respiration noted BREASTS: symmetric, non-tender, no masses palpable ABDOMEN: Soft, nontender, nondistended, gravid. GU: normal appearing external female genitalia, multiparous normal appearing cervix, scant white discharge in vagina, no lesions noted MUSCULOSKELETAL: Normal range of motion. EXT:  No edema and no tenderness. 2+ distal pulses.   Assessment and Plan:  Pregnancy: [id number] at 4w2dby LMP  1. Supervision of high risk pregnancy, antepartum Has anatomy scheduled for tomorrow - Cytology - PAP( [facility]) - Flu Vaccine QUAD 36+  mos IM  2. Antepartum multigravida of advanced maternal age  467 Language barrier, cultural differences AFish farm managerused  4. Gestational diabetes mellitus (GDM) in second trimester, gestational diabetes method of control unspecified - Pt with elevated early 2 hr, reviewed diagnosis with patient - Recommendation to start checking CBGs - she feels comfortable checking CBGs, comfortable with diet, declines to see diabetic educator  5. History of postpartum hemorrhage  6. Group B streptococcal bacteriuria ppx in labor  7. Obesity in  pregnancy  8. Elevated blood pressure reading in office without diagnosis of hypertension - Elevated once, has not repeated and been high  9. [redacted] weeks gestation of pregnancy  10. Nonintractable headache, unspecified chronicity pattern, unspecified headache type - Takes tylenol with minimal improvement - Caffeine sometimes helps, sometimes food and water - sent fioricet to pharmacy  11. Counseled about COVID-19 virus infection COVID-19 Vaccine Counseling: The patient was counseled on the potential benefits and lack of known risks of COVID vaccination, during pregnancy and breastfeeding, during today's visit. The patient's questions and concerns were addressed today, including safety of the vaccination and potential side effects as they have been published by ACOG and SMFM. The patient has been informed that there have not been any documented vaccine related injuries, deaths or birth defects to infant or mom after receiving the COVID-19 vaccine to date. The patient has been made aware that although she is not at increased risk of contracting COVID-19 during pregnancy, she is at increased risk of developing severe disease and complications if she contracts COVID-19 while pregnant. All patient questions were addressed during our visit today. The patient is still unsure of her decision for vaccination.   Preterm labor symptoms and general obstetric  precautions including but not limited to vaginal bleeding, contractions, leaking of fluid and fetal movement were reviewed in detail with the patient.  Please refer to After Visit Summary for other counseling recommendations.   Return in about 2 weeks (around 08/20/2020) for high OB, in person.  Sloan Leiter 08/08/2020 2:39 PM

## 2020-08-06 NOTE — Patient Instructions (Signed)
AREA PEDIATRIC/FAMILY PRACTICE PHYSICIANS  [place] ([address]) . [facility] o Chambliss, MD; Eniola, MD; Hale, MD; Hensel, MD; McDiarmid, MD; McIntyer, MD; Neal, MD; Walden, MD o [address] [place], [place] [address] o [phone] o Mon-Fri 8:30-12:30, 1:30-5:00 o Providers come to see babies at [facility] o Accepting Medicaid . [facility] o Limited providers who accept newborns: Koirala, MD; Morrow, MD; Wolters, MD o [address], [place], [place] [address] o [phone] o Mon-Fri 8:00-5:30 o Babies seen by providers at [facility] o Does NOT accept Medicaid o Please call early in hospitalization for appointment (limited availability)  . [facility] o Mulberry, MD o [address] [place], [place] [address] o [phone] o Mon, Tue, Thur, Fri 8:30-5:00, Wed 10:00-7:00 (closed 1-2pm) o Babies seen by [facility] providers o Accepting Medicaid . [facility] - Pediatrician o Rubin, MD o [address], [place], [place] [address] o [phone] o Mon-Fri 8:30-5:00, Sat 8:30-12:00 o Provider comes to see babies at [facility] o Accepting Medicaid o Must have been referred from current patients or contacted office prior to delivery . [facility] ([facility]) o Brown, MD; Chandler, MD; Ettefagh, MD; Grant, MD; Lester, MD; McCormick, MD; McQueen, MD; Prose, MD; Simha, MD; Stanley, MD; Stryffeler, NP; Tebben, NP o [address], [place], [place] [address] o [phone] o Mon, Tue, Thur, Fri 8:30-5:30, Wed 9:30-5:30, Sat 8:30-12:30 o Babies seen by [facility] providers o Accepting Medicaid o Only accepting infants of first-time parents or siblings of current patients o Hospital discharge coordinator will make follow-up appointment . Jack Amos o [address],  [place], [place]  [address] o [phone]   Fax - [phone] . [facility] o [address], [place], [place]  [address] o Phone - [phone]   Fax - [phone] . Shilpa Gosrani o [address], [place], [place]  [address] o [phone]  [place] ([address]) . [facility] o Bates, MD; [place], MD; Cooper, Cox, MD; MD; Davis, MD; Dovico, MD; Ettefaugh, MD; Little, MD; Lowe, MD; Keiffer, MD; Melvin, MD; Sumner, MD; Williams, MD o [address], [place], [place] [address] o [phone] o Mon-Fri 8:30-5:00 (extended evenings Mon-Thur as needed), Sat-Sun 10:00-1:00 o Providers come to see babies at [facility] o Accepting Medicaid for families of first-time babies and families with all children in the household age 3 and under. Must register with office prior to making appointment (M-F only). . [facility] o Henson, NP; Knapp, MD; Lalonde, MD; Tysinger, PA o [address] [place], [place] [address] o [phone] o Mon-Fri 8:00-5:00 o Babies seen by providers at [facility] o Does NOT accept Medicaid/Commercial Insurance Only . [facility] ([facility]  o Artis, MD; Barnes, MD; Bratton, MD; Coccaro, MD; Lockett Gardner, MD; Kramer, MD; Marshall, MD; Netherton, MD; Poleto, MD; Skinner, MD o [address] [place], [place] [address] o [phone] o Mon-Fri 8:30-5:30, Sat (Oct.-Mar.) 9:00-1:00 o Babies seen by providers at [facility] o Accepting Medicaid  [place] ([address]) . [facility]  o Reid, MD; Warner, MD o [address], , [place] [address] o [phone] o Mon-Fri 8:30-5:00, Sat 8:30-12:00 o Providers come to see babies at [facility] o Does NOT accept Medicaid . [facility] o Becker, PA; Hagler, MD; Scifres, PA; Sun, MD; Swayne, MD o [address],  [place], [place] [address] o [phone] o Mon-Fri 8:00-5:00 o Babies seen by providers at [facility] o Does NOT accept Medicaid o Only accepting babies of parents who are patients o Please call early in hospitalization for appointment (limited availability) . [facility] o Clark, MD; Frye, MD; Kelleher, MD; Mack, NP; Miller, MD; O'Keller, MD; Patterson, NP; Pudlo, MD; Puzio, MD; Thomas, MD; Tucker, MD; Twiselton, MD o [address], [place], [place] [address] o [phone] o Mon-Fri 8:00-5:00, Sat 9:00-12:00 o Providers come to see babies at [facility] o Does NOT accept Medicaid  [place] ([address]) . [facility] o Limited providers accepting new patients: Brake, NP; Wharton, PA o [address], [place], [place] [address] o [phone] o Mon-Fri 8:00-5:00 o Babies seen by providers at [facility] o Does NOT accept Medicaid o Only accepting babies of parents who are patients o Please call early in hospitalization for appointment (limited availability) . [facility] o Gay, MD; Quinlan, MD o [address] [place], [place] [address] o [phone] (press 1 to schedule appointment) o Mon-Fri 8:00-5:00 o Providers come to see babies at [facility] o Does NOT accept Medicaid . [facility] o Mazer, MD o [address] [place], [place] [address] o [phone] o Mon-Fri 8:30-5:00 (lunch 12:30-1:00), extended hours by appointment only Wed 5:00-6:30 o Babies seen by [facility] providers o Accepting Medicaid . [facility] o Banks, MD; Jordan, MD; Koberlein, MD o [address], [place], [place] [address] o [phone] o Mon-Fri 8:00-5:00 o Babies seen by [facility] providers o Does NOT accept Medicaid . [facility] o Parker, MD; Hunter, MD; Wallace, DO o [address] [place], [place]  [address] o [phone] o Mon-Fri 8:00-5:00 o Babies seen by [facility] providers o Does NOT accept Medicaid . [facility] o Brandon, PA; Brecken, PA; Christy, NP; Dees, MD; DeClaire, MD; DeWeese, MD; Hansen, NP; Mills, NP; Parrish, NP; Smoot, NP; Summer, MD; Vapne, MD o [address] [place], [place] [address] o [phone] o Mon-Fri 8:30-5:00, Sat 10:00-1:00 o Providers come to see babies at [facility] o Does NOT accept Medicaid o Free prenatal information session Tuesdays at 4:45pm . [facility] o Bouska, MD; Gordon, PA; Jeffery, PA; Weber, PA o [address] [place] [address] o [phone] o Mon-Fri 7:30-5:30 o Babies seen by [facility] providers . [facility] o [address], [place], [place]  [address] o [phone]   Fax - [phone]  [place] ([address] & [address]) . [facility] o [place], [place] o [address] [place], [place] [address] o [phone] o Mon-Thur 8:00-6:00 o Providers come to see babies at [facility] o Accepting Medicaid . [facility] o Anderson, NP; Badger, MD; Beal, PA; Spencer, PA o [address] [place],  [address] o [phone] o Mon-Thur 7:30-7:30, Fri 7:30-4:30 o Babies seen by [facility] providers o Accepting Medicaid . [facility] o Agbuya, MD; Klett, NP; Romgoolam, MD o [address], [place],  [address] o [phone] o Mon-Fri 8:30-5:00, Sat 8:30-12:00 o Providers come to see babies at [facility] o Accepting Medicaid o Must have "Meet & Greet" appointment at office prior to delivery . [facility] ([facility] o McCord,   MD; Wallace, MD; Wood, MD o [address], [place], [place] [address] o [phone] o Mon-Wed 8:00-6:00, Thur-Fri 8:00-5:00, Sat 9:00-12:00 o Providers come to  see babies at [facility] o Does NOT accept Medicaid o Only accepting siblings of current patients . [facility]  o [address], [place], [place]  [address] o [phone]   Fax - [phone] . [facility] o [address], [place], [place]  [address] o [phone]   Fax - [phone]  [place] ([address] & [address]) . [facility] o Cirigliano, DO; Matthews, DO o [address] [place], [place] [address] o [phone] o Mon-Fri 7:00-5:00 o Babies seen by [facility] providers o Does NOT accept Medicaid . [facility] o Briscoe, MD; Howley, PA; Moreira, PA o [address], [place], [place] [address] o [phone] o Mon-Fri 8:00-5:00 o Babies seen by [facility] providers o Accepting Medicaid . [facility] o Boyd, MD; Church, PA; Jones, NP; Osborn, PA o [address], , [place] [address] o [phone] o Mon-Fri 8:00-5:00 o Babies seen by providers at [facility] o Accepting Medicaid   

## 2020-08-06 NOTE — Progress Notes (Signed)
Pt reports ongoing issue with headaches. Reports headache 4/10 at this time; took 500 mg Tylenol before leaving home. States Tylenol has not helped in the past few days.   Would like to discuss long term contraception options. Interested in BTL.  Fleet Contras RN 08/06/20

## 2020-08-07 ENCOUNTER — Other Ambulatory Visit: Payer: Self-pay | Admitting: *Deleted

## 2020-08-07 ENCOUNTER — Encounter: Payer: Self-pay | Admitting: *Deleted

## 2020-08-07 ENCOUNTER — Ambulatory Visit: Payer: Medicaid Other | Attending: Obstetrics and Gynecology

## 2020-08-07 ENCOUNTER — Ambulatory Visit: Payer: Medicaid Other | Admitting: *Deleted

## 2020-08-07 DIAGNOSIS — R8271 Bacteriuria: Secondary | ICD-10-CM

## 2020-08-07 DIAGNOSIS — R030 Elevated blood-pressure reading, without diagnosis of hypertension: Secondary | ICD-10-CM | POA: Diagnosis present

## 2020-08-07 DIAGNOSIS — O24112 Pre-existing diabetes mellitus, type 2, in pregnancy, second trimester: Secondary | ICD-10-CM

## 2020-08-07 DIAGNOSIS — O99210 Obesity complicating pregnancy, unspecified trimester: Secondary | ICD-10-CM

## 2020-08-07 DIAGNOSIS — O09529 Supervision of elderly multigravida, unspecified trimester: Secondary | ICD-10-CM | POA: Insufficient documentation

## 2020-08-07 DIAGNOSIS — O0990 Supervision of high risk pregnancy, unspecified, unspecified trimester: Secondary | ICD-10-CM

## 2020-08-07 DIAGNOSIS — O169 Unspecified maternal hypertension, unspecified trimester: Secondary | ICD-10-CM | POA: Diagnosis not present

## 2020-08-09 LAB — CYTOLOGY - PAP
Chlamydia: NEGATIVE
Comment: NEGATIVE
Comment: NEGATIVE
Comment: NORMAL
Diagnosis: NEGATIVE
High risk HPV: NEGATIVE
Neisseria Gonorrhea: NEGATIVE

## 2020-08-13 ENCOUNTER — Encounter: Payer: Self-pay | Admitting: Obstetrics

## 2020-08-22 ENCOUNTER — Encounter: Payer: Self-pay | Admitting: *Deleted

## 2020-08-27 ENCOUNTER — Ambulatory Visit (INDEPENDENT_AMBULATORY_CARE_PROVIDER_SITE_OTHER): Payer: Medicaid Other

## 2020-08-27 ENCOUNTER — Other Ambulatory Visit: Payer: Self-pay

## 2020-08-27 VITALS — BP 126/82 | HR 98 | Temp 98.2°F | Wt 246.0 lb

## 2020-08-27 DIAGNOSIS — Z603 Acculturation difficulty: Secondary | ICD-10-CM

## 2020-08-27 DIAGNOSIS — O0990 Supervision of high risk pregnancy, unspecified, unspecified trimester: Secondary | ICD-10-CM

## 2020-08-27 DIAGNOSIS — O24419 Gestational diabetes mellitus in pregnancy, unspecified control: Secondary | ICD-10-CM

## 2020-08-27 DIAGNOSIS — Z3A24 24 weeks gestation of pregnancy: Secondary | ICD-10-CM

## 2020-08-27 MED ORDER — COMFORT FIT MATERNITY SUPP LG MISC: 1.0000 [IU] | Freq: Every day | 0 refills | Status: DC

## 2020-08-27 MED ORDER — PREPLUS 27-1 MG PO TABS: 1.0000 | ORAL_TABLET | Freq: Every day | ORAL | 13 refills | Status: DC

## 2020-08-27 NOTE — Addendum Note (Signed)
Addended by: Gerrit Heck L on: 08/27/2020 02:29 PM   Modules accepted: Orders

## 2020-08-27 NOTE — Patient Instructions (Addendum)
 PREGNANCY SUPPORT BELT: You are not alone, Seventy-five percent of women have some sort of abdominal or back pain at some point in their pregnancy. Your baby is growing at a fast pace, which means that your whole body is rapidly trying to adjust to the changes. As your uterus grows, your back may start feeling a bit under stress and this can result in back or abdominal pain that can go from mild, and therefore bearable, to severe pains that will not allow you to sit or lay down comfortably, When it comes to dealing with pregnancy-related pains and cramps, some pregnant women usually prefer natural remedies, which the market is filled with nowadays. For example, wearing a pregnancy support belt can help ease and lessen your discomfort and pain. WHAT ARE THE BENEFITS OF WEARING A PREGNANCY SUPPORT BELT? A pregnancy support belt provides support to the lower portion of the belly taking some of the weight of the growing uterus and distributing to the other parts of your body. It is designed make you comfortable and gives you extra support. Over the years, the pregnancy apparel market has been studying the needs and wants of pregnant women and they have come up with the most comfortable pregnancy support belts that woman could ever ask for. In fact, you will no longer have to wear a stretched-out or bulky pregnancy belt that is visible underneath your clothes and makes you feel even more uncomfortable. Nowadays, a pregnancy support belt is made of comfortable and stretchy materials that will not irritate your skin but will actually make you feel at ease and you will not even notice you are wearing it. They are easy to put on and adjust during the day and can be worn at night for additional support.  BENEFITS: . Relives Back pain . Relieves Abdominal Muscle and Leg Pain . Stabilizes the Pelvic Ring . Offers a Cushioned Abdominal Lift Pad . Relieves pressure on the Sciatic Nerve Within Minutes WHERE TO GET  YOUR PREGNANCY BELT: [employer] [phone] @[address] [place], [place] [address]        Gestational Diabetes Mellitus, Self Care Caring for yourself after you have been diagnosed with gestational diabetes (gestational diabetes mellitus) means keeping your blood sugar (glucose) under control. You can do that with a balance of:  Nutrition.  Exercise.  Lifestyle changes.  Medicines or insulin, if necessary.  Support from your team of health care providers and others. The following information explains what you need to know to manage your gestational diabetes at home. What are the risks? If gestational diabetes is treated, it is unlikely to cause problems. If it is not controlled with treatment, it may cause problems during labor and delivery, and some of those problems can be harmful to the unborn baby (fetus) and the mother. Uncontrolled gestational diabetes may also cause the newborn baby to have breathing problems and low blood glucose. Women who get gestational diabetes are more likely to develop it if they get pregnant again, and they are more likely to develop type 2 diabetes in the future. How to monitor blood glucose   Check your blood glucose every day during your pregnancy. Do this as often as told by your health care provider.  Contact your health care provider if your blood glucose is above your target for two tests in a row. Your health care provider will set individualized treatment goals for you. Generally, the goal of treatment is to maintain the following blood   glucose levels during pregnancy:  Before meals (preprandial): at or below 95 mg/dL (5.3 mmol/L).  After meals (postprandial): ? One hour after a meal: at or below 140 mg/dL (7.8 mmol/L). ? Two hours after a meal: at or below 120 mg/dL (6.7 mmol/L).  A1c (hemoglobin A1c) level: 6-6.5%. How to manage hyperglycemia and hypoglycemia Hyperglycemia symptoms Hyperglycemia, also called  high blood glucose, occurs when blood glucose is too high. Make sure you know the early signs of hyperglycemia, such as:  Increased thirst.  Hunger.  Feeling very tired.  Needing to urinate more often than usual.  Blurry vision. Hypoglycemia symptoms Hypoglycemia, also called low blood glucose, occurs with a blood glucose level at or below 70 mg/dL (3.9 mmol/L). The risk for hypoglycemia increases during or after exercise, during sleep, during illness, and when skipping meals or not eating for a long time (fasting). Symptoms may include:  Hunger.  Anxiety.  Sweating and feeling clammy.  Confusion.  Dizziness or feeling light-headed.  Sleepiness.  Nausea.  Increased heart rate.  Headache.  Blurry vision.  Irritability.  Tingling or numbness around the mouth, lips, or tongue.  A change in coordination.  Restless sleep.  Fainting.  Seizure. It is important to know the symptoms of hypoglycemia and treat it right away. Always have a 15-gram rapid-acting carbohydrate snack with you to treat low blood glucose. Family members and close friends should also know the symptoms and should understand how to treat hypoglycemia, in case you are not able to treat yourself. Treating hypoglycemia If you are alert and able to swallow safely, follow the 15:15 rule:  Take 15 grams of a rapid-acting carbohydrate. Talk with your health care provider about how much you should take.  Rapid-acting options include: ? Glucose pills (take 15 grams). ? 6-8 pieces of hard candy. ? 4-6 oz (120-150 mL) of fruit juice. ? 4-6 oz (120-150 mL) of regular (not diet) soda. ? 1 Tbsp (15 mL) honey or sugar.  Check your blood glucose 15 minutes after you take the carbohydrate.  If the repeat blood glucose level is still at or below 70 mg/dL (3.9 mmol/L), take 15 grams of a carbohydrate again.  If your blood glucose level does not increase above 70 mg/dL (3.9 mmol/L) after 3 tries, seek emergency  medical care.  After your blood glucose level returns to normal, eat a meal or a snack within 1 hour. Treating severe hypoglycemia Severe hypoglycemia is when your blood glucose level is at or below 54 mg/dL (3 mmol/L). Severe hypoglycemia is an emergency. Do not wait to see if the symptoms will go away. Get medical help right away. Call your local emergency services (911 in the [place] If you have severe hypoglycemia and you cannot eat or drink, you may need an injection of glucagon. A family member or close friend should learn how to check your blood glucose and how to give you a glucagon injection. Ask your health care provider if you need to have an emergency glucagon injection kit available. Severe hypoglycemia may need to be treated in a hospital. The treatment may include getting glucose through an IV. You may also need treatment for the cause of your hypoglycemia. Follow these instructions at home: Take diabetes medicines as told  If your health care provider prescribed insulin or diabetes medicines, take them every day.  Do not run out of insulin or other diabetes medicines that you take. Plan ahead so you always have these available.  If you use insulin, adjust   your dosage based on how physically active you are and what foods you eat. Your health care provider will tell you how to adjust your dosage. Make healthy food choices  The things that you eat and drink affect your blood glucose (and your insulin dose, if this applies). Making good choices helps to control your diabetes and prevent other health problems. A healthy meal plan includes eating lean proteins, complex carbohydrates, fresh fruits and vegetables, low-fat dairy products, and healthy fats. Make an appointment to see a diet and nutrition specialist (registered dietitian) to help you create an eating plan that is right for you. Make sure that you:  Follow instructions from your health care provider about eating or drinking  restrictions.  Drink enough fluid to keep your urine pale yellow.  Eat healthy snacks between nutritious meals.  Keep a record of the carbohydrates that you eat. Do this by reading food labels and learning the standard serving sizes of foods.  Follow your sick day plan whenever you cannot eat or drink as usual. Make this plan in advance with your health care provider.  Stay active  Do 30 or more minutes of physical activity a day, or as much physical activity as your health care provider recommends during your pregnancy. ? Doing 10 minutes of exercise starting 30 minutes after each meal may help to control postprandial blood glucose levels.  If you start a new exercise or activity, work with your health care provider to adjust your insulin, medicines, or food intake as needed. Make healthy lifestyle choices  Do not drink alcohol.  Do not use any tobacco products, such as cigarettes, chewing tobacco, and e-cigarettes. If you need help quitting, ask your health care provider.  Learn to manage stress. If you need help with this, ask your health care provider. Care for your body  Keep your immunizations up to date.  Brush your teeth and gums two times a day, and floss one or more times a day. Visit your dentist one or more times every 6 months.  Maintain a healthy weight during your pregnancy. General instructions  Take over-the-counter and prescription medicines only as told by your health care provider.  Talk with your health care provider about your risk for high blood pressure during pregnancy (preeclampsia or eclampsia).  Share your diabetes management plan with people in your workplace, school, and household.  Check your urine for ketones during your pregnancy when you are ill and as told by your health care provider.  Carry a medical alert card or wear medical alert jewelry that says you have gestational diabetes.  Keep all follow-up visits during your pregnancy  (prenatal) and after delivery (postnatal) as told by your health care provider. This is important. Get the care that you need after delivery  Have your blood glucose level checked 4-12 weeks after delivery. This is done with an oral glucose tolerance test (OGTT).  Get screened for diabetes at least every 3 years, or as often as told by your health care provider. Questions to ask your health care provider  Do I need to meet with a diabetes educator?  Where can I find a support group for people with gestational diabetes? Where to find more information For more information about gestational diabetes, visit:  American Diabetes Association (ADA): www.diabetes.org  Centers for Disease Control and Prevention (CDC): www.cdc.gov Summary  Check your blood glucose every day during your pregnancy. Do this as often as told by your health care provider.  If your   health care provider prescribed insulin or diabetes medicines, take them every day as told.  Keep all follow-up visits during your pregnancy (prenatal) and after delivery (postnatal) as told by your health care provider. This is important.  Have your blood glucose level checked 4-12 weeks after delivery. This information is not intended to replace advice given to you by your health care provider. Make sure you discuss any questions you have with your health care provider. Document Revised: 04/12/2018 Document Reviewed: 11/23/2015 Elsevier Patient Education  2020 Elsevier Inc.   

## 2020-08-27 NOTE — Progress Notes (Signed)
HIGH-RISK PREGNANCY OFFICE VISIT  Patient name: Elaine Kelly MRN [id number]  Date of birth: 04/27/1986 Chief Complaint:   Routine Prenatal Visit  Subjective:   Elaine Kelly is a 34 y.o. [id number] female at [redacted]w[redacted]d with an Estimated Date of Delivery: 12/15/20 being seen today for ongoing management of a high-risk pregnancy aeb has Obese; Language barrier, cultural differences; Supervision of high risk pregnancy, antepartum; AMA (advanced maternal age) multigravida 35+; Elevated blood pressure reading in office without diagnosis of hypertension; Obesity in pregnancy; Group B streptococcal bacteriuria; History of postpartum hemorrhage; and Gestational diabetes on their problem list.  Patient presents today with complaint of pressure. She states the pressure is experienced with lots of movement or standing for long periods of time. Patient also reports that she feels hungry all the time. Patient endorses fetal movement. Patient denies abdominal cramping and contractions as well as vaginal concerns including abnormal discharge, leaking of fluid, and bleeding.   . Vag. Bleeding: None.  Movement: Present.  Reviewed past medical,surgical, social, obstetrical and family history as well as problem list, medications and allergies.  Objective   Vitals:   08/27/20 1345  BP: 126/82  Pulse: 98  Temp: 98.2 F (36.8 C)  Weight: 246 lb (111.6 kg)  Body mass index is 40.94 kg/m.  Total Weight Gain:35 lb (15.9 kg)         Physical Examination:   General appearance: Well appearing, and in no distress  Mental status: Alert, oriented to person, place, and time  Skin: Warm & dry  Cardiovascular: Normal heart rate noted  Respiratory: Normal respiratory effort, no distress  Abdomen: Soft, gravid, nontender, AGA with Fundal height of Fundal Height: 27 cm  Pelvic: Cervical exam deferred           Extremities: Edema: Mild pitting, slight indentation  Fetal Status: Fetal Heart Rate (bpm): 160  Movement:  Present   No results found for this or any previous visit (from the past 24 hour(s)).  Assessment & Plan:  High-risk pregnancy of a 34 y.o., [id number] at [redacted]w[redacted]d with an Estimated Date of Delivery: 12/15/20   1. Supervision of high risk pregnancy, antepartum -TWG 35 lbs. -Reviewed weight gain. -Patient given option for nutrution referral and declines stating she had with previous pregnancy and has knowledge. -Discussed light walking in the evenings and increasing protein in her diet.   2. [redacted] weeks gestation of pregnancy -Doing well. -Anticipatory guidance for upcoming appts.   3. Language barrier, cultural differences -Interpreter present  4. Gestational diabetes mellitus (GDM) in second trimester, gestational diabetes method of control unspecified -Patient reports fasting BS was 130, ppBreakfast was 150.  She reports yesterday it was 125 fasting, ppLunch 157. She reports BS was taken 3 hours after eating.  -Discussed proper parameters for blood sugars. -Given informational sheet. -Instructed to take BS 4x daily-Fasting and 2Hrs PP for the next 2 weeks and MD will determine need for oral meds.    Meds: No orders of the defined types were placed in this encounter.  Labs/procedures today:  Lab Orders  No laboratory test(s) ordered today     Reviewed: Preterm labor symptoms and general obstetric precautions including but not limited to vaginal bleeding, contractions, leaking of fluid and fetal movement were reviewed in detail with the patient.  All questions were answered.  Follow-up: Return in about 2 weeks (around 09/10/2020) for HROB with MD for BS Check.  No orders of the defined types were placed in this encounter.  Cherre Robins MSN,  CNM 08/27/2020

## 2020-08-27 NOTE — Progress Notes (Signed)
Elaine Kelly in person ARabic interpreter helped with visit.

## 2020-08-28 ENCOUNTER — Encounter: Payer: Self-pay | Admitting: General Practice

## 2020-09-04 ENCOUNTER — Encounter: Payer: Self-pay | Admitting: *Deleted

## 2020-09-04 ENCOUNTER — Other Ambulatory Visit: Payer: Medicaid Other

## 2020-09-04 ENCOUNTER — Ambulatory Visit: Payer: Medicaid Other | Admitting: *Deleted

## 2020-09-04 ENCOUNTER — Ambulatory Visit: Payer: Medicaid Other | Attending: Obstetrics and Gynecology

## 2020-09-04 ENCOUNTER — Other Ambulatory Visit: Payer: Self-pay

## 2020-09-04 DIAGNOSIS — O0990 Supervision of high risk pregnancy, unspecified, unspecified trimester: Secondary | ICD-10-CM

## 2020-09-04 DIAGNOSIS — R030 Elevated blood-pressure reading, without diagnosis of hypertension: Secondary | ICD-10-CM | POA: Diagnosis present

## 2020-09-04 DIAGNOSIS — O99212 Obesity complicating pregnancy, second trimester: Secondary | ICD-10-CM | POA: Diagnosis not present

## 2020-09-04 DIAGNOSIS — Z362 Encounter for other antenatal screening follow-up: Secondary | ICD-10-CM

## 2020-09-04 DIAGNOSIS — O0942 Supervision of pregnancy with grand multiparity, second trimester: Secondary | ICD-10-CM

## 2020-09-04 DIAGNOSIS — O99210 Obesity complicating pregnancy, unspecified trimester: Secondary | ICD-10-CM | POA: Diagnosis present

## 2020-09-04 DIAGNOSIS — R8271 Bacteriuria: Secondary | ICD-10-CM | POA: Insufficient documentation

## 2020-09-04 DIAGNOSIS — O09292 Supervision of pregnancy with other poor reproductive or obstetric history, second trimester: Secondary | ICD-10-CM

## 2020-09-04 DIAGNOSIS — O09522 Supervision of elderly multigravida, second trimester: Secondary | ICD-10-CM | POA: Diagnosis not present

## 2020-09-04 DIAGNOSIS — Z3A25 25 weeks gestation of pregnancy: Secondary | ICD-10-CM

## 2020-09-04 DIAGNOSIS — O24419 Gestational diabetes mellitus in pregnancy, unspecified control: Secondary | ICD-10-CM | POA: Diagnosis not present

## 2020-09-04 DIAGNOSIS — O24112 Pre-existing diabetes mellitus, type 2, in pregnancy, second trimester: Secondary | ICD-10-CM | POA: Insufficient documentation

## 2020-09-05 ENCOUNTER — Other Ambulatory Visit: Payer: Self-pay | Admitting: *Deleted

## 2020-09-05 DIAGNOSIS — Z362 Encounter for other antenatal screening follow-up: Secondary | ICD-10-CM

## 2020-09-10 ENCOUNTER — Ambulatory Visit (INDEPENDENT_AMBULATORY_CARE_PROVIDER_SITE_OTHER): Payer: Medicaid Other | Admitting: Family Medicine

## 2020-09-10 ENCOUNTER — Other Ambulatory Visit: Payer: Self-pay

## 2020-09-10 VITALS — BP 130/68 | HR 90 | Wt 245.0 lb

## 2020-09-10 DIAGNOSIS — O09523 Supervision of elderly multigravida, third trimester: Secondary | ICD-10-CM

## 2020-09-10 DIAGNOSIS — O409XX0 Polyhydramnios, unspecified trimester, not applicable or unspecified: Secondary | ICD-10-CM

## 2020-09-10 DIAGNOSIS — O0990 Supervision of high risk pregnancy, unspecified, unspecified trimester: Secondary | ICD-10-CM

## 2020-09-10 DIAGNOSIS — Z8759 Personal history of other complications of pregnancy, childbirth and the puerperium: Secondary | ICD-10-CM

## 2020-09-10 DIAGNOSIS — O24415 Gestational diabetes mellitus in pregnancy, controlled by oral hypoglycemic drugs: Secondary | ICD-10-CM

## 2020-09-10 DIAGNOSIS — O99210 Obesity complicating pregnancy, unspecified trimester: Secondary | ICD-10-CM

## 2020-09-10 HISTORY — DX: Polyhydramnios, unspecified trimester, not applicable or unspecified: O40.9XX0

## 2020-09-10 MED ORDER — METFORMIN HCL 500 MG PO TABS: 500.0000 mg | ORAL_TABLET | Freq: Two times a day (BID) | ORAL | 3 refills | Status: DC

## 2020-09-10 MED ORDER — PREPLUS 27-1 MG PO TABS: 1.0000 | ORAL_TABLET | Freq: Every day | ORAL | 13 refills | Status: DC

## 2020-09-10 NOTE — Progress Notes (Signed)
PRENATAL VISIT NOTE  Subjective:  Elaine Kelly is a 34 y.o. [id number] at [redacted]w[redacted]d being seen today for ongoing prenatal care.  She is currently monitored for the following issues for this high-risk pregnancy and has Obese; Language barrier, cultural differences; Supervision of high risk pregnancy, antepartum; AMA (advanced maternal age) multigravida 35+; Elevated blood pressure reading in office without diagnosis of hypertension; Obesity in pregnancy; Group B streptococcal bacteriuria; History of postpartum hemorrhage; Gestational diabetes; and Polyhydramnios affecting pregnancy on their problem list.  Patient reports pelvic pressure- no contractions. .  Contractions: Not present. Vag. Bleeding: None.  Movement: Present. Denies leaking of fluid.    Review of BG log from 10/27 to present day Fasting 120- 150 B 135-175 L 170-199 D 121-163   The following portions of the patient's history were reviewed and updated as appropriate: allergies, current medications, past family history, past medical history, past social history, past surgical history and problem list.   Objective:   Vitals:   09/10/20 1534  BP: 130/68  Pulse: 90  Weight: 245 lb (111.1 kg)    Fetal Status: Fetal Heart Rate (bpm): 162 Fundal Height: 30 cm Movement: Present     General:  Alert, oriented and cooperative. Patient is in no acute distress.  Skin: Skin is warm and dry. No rash noted.   Cardiovascular: Normal heart rate noted  Respiratory: Normal respiratory effort, no problems with respiration noted  Abdomen: Soft, gravid, appropriate for gestational age.  Pain/Pressure: Absent     Pelvic: Cervical exam deferred        Extremities: Normal range of motion.  Edema: Trace  Mental Status: Normal mood and affect. Normal behavior. Normal judgment and thought content.   Assessment and Plan:  Pregnancy: [id number] at [redacted]w[redacted]d  1. Supervision of high risk pregnancy, antepartum Scheduled for COVID 19 vaccines through [facility] today in clinic! Reviewed plan for labs and Tdap at next visit  2. Polyhydramnios affecting pregnancy AFI on 11/2 was 30 Has follow up scheduled with MFM in December   3. History of postpartum hemorrhage LD aware  4. Gestational diabetes mellitus (GDM) in third trimester- Starting medication TODAY 11/11- has fetal echo scheduled with Dr Elizebeth Brooking. She aware of appointment and plans on attending  Blood glucose levels are universally elevated. Discussed starting medications today. Reviewed with patient that insulin is associated with the best maternal and fetal outcomes and is the best way to control blood glucose levels. At this time the patient declined insulin treatment but is willing to start oral medications and would be open to insulin if oral medications do not improve her sugars.   - Metformin 500 BID started today and sent to pharmacy - Review of blood glucose log in 1 week - Plan to increase to 1000mg  BID if needed - Discussed continued 4x per day blood glucose checks  - Patient is going to food market today  5. Multigravida of advanced maternal age in third trimester Low risk NIP  6. Obesity in pregnancy TWG= 34 lb (15.4 kg)   COVID-19 Vaccine Counseling: The patient was counseled on the potential benefits and lack of known risks of COVID vaccination, during pregnancy and breastfeeding, during today's visit. The patient's questions and concerns were addressed today, including safety of the vaccination and potential side effects as they have been published by ACOG and SMFM. The patient has been informed that there have not been any documented vaccine related injuries, deaths or birth defects to infant or mom after receiving the  COVID-19 vaccine to date. The patient has been made aware that although she is not at increased risk of contracting COVID-19 during pregnancy, she is at increased risk of developing severe disease and complications if she contracts COVID-19 while  pregnant. All patient questions were addressed during our visit today. The patient is planning to get vaccinated and we had time today to schedule her first and second doses of Moderna through the [facility] website today. [facility] [address]. The print out was given to the patient.    Preterm labor symptoms and general obstetric precautions including but not limited to vaginal bleeding, contractions, leaking of fluid and fetal movement were reviewed in detail with the patient. Please refer to After Visit Summary for other counseling recommendations.   Return in about 1 week (around 09/17/2020) for Routine prenatal care- Follow up blood glucose after starting medication, MD only, High Risk OB.  Future Appointments  Date Time Provider Department Center  10/04/2020 12:30 PM [facility] NURSE [facility]  10/04/2020 12:45 PM [facility] US5 [facility]    Federico Flake, MD

## 2020-09-10 NOTE — Progress Notes (Signed)
Lots of pressure in abdomen  Moving and bending makes it worse.

## 2020-09-17 ENCOUNTER — Encounter: Payer: Self-pay | Admitting: Obstetrics and Gynecology

## 2020-09-17 ENCOUNTER — Other Ambulatory Visit: Payer: Self-pay

## 2020-09-17 ENCOUNTER — Ambulatory Visit (INDEPENDENT_AMBULATORY_CARE_PROVIDER_SITE_OTHER): Payer: Medicaid Other | Admitting: Obstetrics and Gynecology

## 2020-09-17 VITALS — BP 123/77 | HR 89 | Wt 245.0 lb

## 2020-09-17 DIAGNOSIS — R8271 Bacteriuria: Secondary | ICD-10-CM

## 2020-09-17 DIAGNOSIS — O09523 Supervision of elderly multigravida, third trimester: Secondary | ICD-10-CM

## 2020-09-17 DIAGNOSIS — O0990 Supervision of high risk pregnancy, unspecified, unspecified trimester: Secondary | ICD-10-CM

## 2020-09-17 DIAGNOSIS — R030 Elevated blood-pressure reading, without diagnosis of hypertension: Secondary | ICD-10-CM

## 2020-09-17 DIAGNOSIS — Z603 Acculturation difficulty: Secondary | ICD-10-CM

## 2020-09-17 DIAGNOSIS — O0993 Supervision of high risk pregnancy, unspecified, third trimester: Secondary | ICD-10-CM

## 2020-09-17 DIAGNOSIS — Z23 Encounter for immunization: Secondary | ICD-10-CM | POA: Diagnosis not present

## 2020-09-17 DIAGNOSIS — O409XX0 Polyhydramnios, unspecified trimester, not applicable or unspecified: Secondary | ICD-10-CM

## 2020-09-17 DIAGNOSIS — O99210 Obesity complicating pregnancy, unspecified trimester: Secondary | ICD-10-CM

## 2020-09-17 DIAGNOSIS — O24415 Gestational diabetes mellitus in pregnancy, controlled by oral hypoglycemic drugs: Secondary | ICD-10-CM

## 2020-09-17 DIAGNOSIS — Z8759 Personal history of other complications of pregnancy, childbirth and the puerperium: Secondary | ICD-10-CM

## 2020-09-17 LAB — POCT URINALYSIS DIP (DEVICE)
Bilirubin Urine: NEGATIVE
Glucose, UA: NEGATIVE mg/dL
Hgb urine dipstick: NEGATIVE
Ketones, ur: NEGATIVE mg/dL
Leukocytes,Ua: NEGATIVE
Nitrite: NEGATIVE
Protein, ur: NEGATIVE mg/dL
Specific Gravity, Urine: 1.025 (ref 1.005–1.030)
Urobilinogen, UA: 0.2 mg/dL (ref 0.0–1.0)
pH: 6.5 (ref 5.0–8.0)

## 2020-09-17 MED ORDER — METFORMIN HCL 500 MG PO TABS: 1000.0000 mg | ORAL_TABLET | Freq: Two times a day (BID) | ORAL | 3 refills | Status: DC

## 2020-09-17 NOTE — Progress Notes (Signed)
   PRENATAL VISIT NOTE  Subjective:  Elaine Kelly is a 34 y.o. [id number] at [redacted]w[redacted]d being seen today for ongoing prenatal care.  She is currently monitored for the following issues for this high-risk pregnancy and has Obese; Language barrier, cultural differences; Supervision of high risk pregnancy, antepartum; AMA (advanced maternal age) multigravida 35+; Elevated blood pressure reading in office without diagnosis of hypertension; Obesity in pregnancy; Group B streptococcal bacteriuria; History of postpartum hemorrhage; Gestational diabetes; and Polyhydramnios affecting pregnancy on their problem list.  Patient reports no complaints.  Contractions: Not present. Vag. Bleeding: None.  Movement: Present. Denies leaking of fluid.   The following portions of the patient's history were reviewed and updated as appropriate: allergies, current medications, past family history, past medical history, past social history, past surgical history and problem list.   Objective:   Vitals:   09/17/20 1032  BP: 123/77  Pulse: 89  Weight: 245 lb (111.1 kg)    Fetal Status:     Movement: Present     General:  Alert, oriented and cooperative. Patient is in no acute distress.  Skin: Skin is warm and dry. No rash noted.   Cardiovascular: Normal heart rate noted  Respiratory: Normal respiratory effort, no problems with respiration noted  Abdomen: Soft, gravid, appropriate for gestational age.  Pain/Pressure: Present     Pelvic: Cervical exam deferred        Extremities: Normal range of motion.  Edema: None  Mental Status: Normal mood and affect. Normal behavior. Normal judgment and thought content.   Assessment and Plan:  Pregnancy: [id number] at [redacted]w[redacted]d  1. Supervision of high risk pregnancy, antepartum tdap today  2. Gestational diabetes mellitus (GDM) in third trimester controlled on oral hypoglycemic drug Metformin 500 mg BID started last week FG: 110-130 PP: 100 - 170 Patient remains with elevated  CBGs, recommended switching to insulin now, as she remains elevated and will likely need to go on insulin at some point, pt would prefer to try doubling the metformin as discussed in her visit last week, we will try this first - f/u appt 1 week  3. Language barrier, cultural differences Arabic interpretor used  4. Multigravida of advanced maternal age in third trimester  5. Elevated blood pressure reading in office without diagnosis of hypertension Normotensive today  6. Group B streptococcal bacteriuria ppx in labor  7. History of postpartum hemorrhage  8. Polyhydramnios affecting pregnancy AFI 30 cm Has f/u scheduled for 12/2  9. Obesity in pregnancy Cont baby asa  Preterm labor symptoms and general obstetric precautions including but not limited to vaginal bleeding, contractions, leaking of fluid and fetal movement were reviewed in detail with the patient. Please refer to After Visit Summary for other counseling recommendations.   Return in about 1 week (around 09/24/2020) for high OB, in person.  Future Appointments  Date Time Provider Department Center  09/25/2020  9:30 AM Catalina Antigua, MD [facility]  10/04/2020 12:30 PM [facility] NURSE [facility]  10/04/2020 12:45 PM [facility] US5 [facility]    Conan Bowens, MD

## 2020-09-25 ENCOUNTER — Encounter: Payer: Medicaid Other | Admitting: Obstetrics and Gynecology

## 2020-10-02 ENCOUNTER — Encounter: Payer: Self-pay | Admitting: Obstetrics and Gynecology

## 2020-10-02 ENCOUNTER — Ambulatory Visit (INDEPENDENT_AMBULATORY_CARE_PROVIDER_SITE_OTHER): Payer: Medicaid Other | Admitting: Obstetrics and Gynecology

## 2020-10-02 ENCOUNTER — Other Ambulatory Visit: Payer: Self-pay

## 2020-10-02 VITALS — BP 128/75 | HR 103 | Wt 244.4 lb

## 2020-10-02 DIAGNOSIS — O0990 Supervision of high risk pregnancy, unspecified, unspecified trimester: Secondary | ICD-10-CM

## 2020-10-02 DIAGNOSIS — Z603 Acculturation difficulty: Secondary | ICD-10-CM

## 2020-10-02 DIAGNOSIS — O409XX0 Polyhydramnios, unspecified trimester, not applicable or unspecified: Secondary | ICD-10-CM

## 2020-10-02 DIAGNOSIS — R030 Elevated blood-pressure reading, without diagnosis of hypertension: Secondary | ICD-10-CM

## 2020-10-02 DIAGNOSIS — R8271 Bacteriuria: Secondary | ICD-10-CM

## 2020-10-02 DIAGNOSIS — Z8759 Personal history of other complications of pregnancy, childbirth and the puerperium: Secondary | ICD-10-CM

## 2020-10-02 DIAGNOSIS — O09523 Supervision of elderly multigravida, third trimester: Secondary | ICD-10-CM

## 2020-10-02 DIAGNOSIS — Z3A29 29 weeks gestation of pregnancy: Secondary | ICD-10-CM | POA: Insufficient documentation

## 2020-10-02 NOTE — Progress Notes (Signed)
PRENATAL VISIT NOTE  Subjective:  Elaine Kelly is a 34 y.o. [id number] at [redacted]w[redacted]d being seen today for ongoing prenatal care.  She is currently monitored for the following issues for this high-risk pregnancy and has Obese; Language barrier, cultural differences; Supervision of high risk pregnancy, antepartum; AMA (advanced maternal age) multigravida 35+; Elevated blood pressure reading in office without diagnosis of hypertension; Obesity in pregnancy; Group B streptococcal bacteriuria; History of postpartum hemorrhage; Gestational diabetes; Polyhydramnios affecting pregnancy; and [redacted] weeks gestation of pregnancy on their problem list.  Patient reports no complaints.  Contractions: Not present. Vag. Bleeding: None.  Movement: Present. Denies leaking of fluid.   Pt taking MTF 1000mg  BID since last appt on 11/15. No prior insulin use. Pt reports history of A1GDM in prior pregnancy.  BG log today:  Fasting BG: 120, 115, 110, 113, 109, 115, 110, 117, 107, 113, 111, 103, 110, 102, 115, 103 PP Breakfast: 133, 130, 134, 135, 130, 139, 130, 135, 130, 120, 138, 120, 129, 133, 135 PP Lunch: 140, 145, 150, 144, 150, 145, 155, 140, 139, 135, 144, 135, 139, 140, 150 PP Dinner: not checking after dinner given minimal food at dinner time  The following portions of the patient's history were reviewed and updated as appropriate: allergies, current medications, past family history, past medical history, past social history, past surgical history and problem list.   Objective:   Vitals:   10/02/20 1343  BP: 128/75  Pulse: (!) 103  Weight: 244 lb 6.4 oz (110.9 kg)    Fetal Status: Fetal Heart Rate (bpm): 167   Movement: Present     General:  Alert, oriented and cooperative. Patient is in no acute distress.  Skin: Skin is warm and dry. No rash noted.   Cardiovascular: Normal heart rate noted  Respiratory: Normal respiratory effort, no problems with respiration noted  Abdomen: Soft, gravid, appropriate  for gestational age.  Pain/Pressure: Present     Pelvic: Cervical exam deferred        Extremities: Normal range of motion.  Edema: None  Mental Status: Normal mood and affect. Normal behavior. Normal judgment and thought content.   Assessment and Plan:  Pregnancy: 10/04/20 at [redacted]w[redacted]d 1. [redacted] weeks gestation of pregnancy, Multigravida of advanced maternal age in third trimester: No red flag symptoms today. +FM and +FHTs. Last ultrasound [redacted]w[redacted]d wnl except for polyhydramnios. Fundal height >dates today. S/p tdap and flu vaccine. - CBC, RPR, HIV collected today - plan to rtc on 12/2 for diabetes educator and initiation of insulin as noted below - plan for next prenatal appointment in 2 weeks or sooner as needed for return precautions as noted below  2. A2GDM: Normal fetal echo at [facility]. Pt recently seen by Dr. [facility] on 11/15 at which time pt was recommended to start insulin but elected to first increase metformin dosing to 1g BID. Pt brought blood glucose log today and still above goal for nearly all blood glucose levels despite good adherence to max dosing of metformin. Of note, not checking PP BG level in evening given that she "eats very little for dinner". Had extensive conversation with pt today regarding the rationale for good blood glucose control in and outside of pregnancy. Pt understands benefits/risks and is on board to start insulin at this time. Given diabetes educator unable today and pt without prior experience using insulin, pt desires to continue metformin until follow-up appointment with diabetes educator on 12/2. - plan for pt to initiate insulin on 12/2 s/p insulin training  with DM educator - instructed pt to continue lifestyle modifications and max dosing of metformin (1g BID) - plan for growth ultrasound on 12/2 and again at 32w and 36w - plan to initiate weekly antenatal testing at 32w (pt aware of plan)  3. Polyhydramnios affecting pregnancy: Ultrasound at [redacted]w[redacted]d showed AFI 30cm.  Fundal height > dates today. - plan for follow-up ultrasound on 12/2 as previously scheduled  4. Group B streptococcal bacteriuria - plan for PCN intrapartum (no current allergies)  5. History of postpartum hemorrhage: Unclear etiology.  6. Elevated blood pressure reading in office without diagnosis of hypertension: Pt with 1 elevated blood pressure on 9/22 in clinic but since has had all blood pressures within normal range. No signs/symptoms of preeclampsia today. Baseline preeclampsia labs wnl on 9/22. - will continue to monitor closely and counseled pt on signs/symptoms of preeclampsia today  7. Language barrier, cultural differences: In-person interpreter present for entirety of office visit today.  Preterm labor symptoms and general obstetric precautions including but not limited to vaginal bleeding, contractions, leaking of fluid and fetal movement were reviewed in detail with the patient. Please refer to After Visit Summary for other counseling recommendations.   Return for needs diabetes educator f/u appt ASAP to start insulin, then f/u prenatal appt in 2 weeks w/ MD.  Future Appointments  Date Time Provider Department Center  10/04/2020 12:30 PM [facility] NURSE [facility]  10/04/2020 12:45 PM [facility]  10/04/2020  3:15 PM [facility]  10/16/2020  9:55 AM Jerene Bears, MD [facility]   [name], MD OB Fellow, [facility] 10/02/2020 5:51 PM

## 2020-10-02 NOTE — Patient Instructions (Signed)
Third Trimester of Pregnancy The third trimester is from week 28 through week 40 (months 7 through 9). The third trimester is a time when the unborn baby (fetus) is growing rapidly. At the end of the ninth month, the fetus is about 20 inches in length and weighs 6-10 pounds. Body changes during your third trimester Your body will continue to go through many changes during pregnancy. The changes vary from woman to woman. During the third trimester:  Your weight will continue to increase. You can expect to gain 25-35 pounds (11-16 kg) by the end of the pregnancy.  You may begin to get stretch marks on your hips, abdomen, and breasts.  You may urinate more often because the fetus is moving lower into your pelvis and pressing on your bladder.  You may develop or continue to have heartburn. This is caused by increased hormones that slow down muscles in the digestive tract.  You may develop or continue to have constipation because increased hormones slow digestion and cause the muscles that push waste through your intestines to relax.  You may develop hemorrhoids. These are swollen veins (varicose veins) in the rectum that can itch or be painful.  You may develop swollen, bulging veins (varicose veins) in your legs.  You may have increased body aches in the pelvis, back, or thighs. This is due to weight gain and increased hormones that are relaxing your joints.  You may have changes in your hair. These can include thickening of your hair, rapid growth, and changes in texture. Some women also have hair loss during or after pregnancy, or hair that feels dry or thin. Your hair will most likely return to normal after your baby is born.  Your breasts will continue to grow and they will continue to become tender. A yellow fluid (colostrum) may leak from your breasts. This is the first milk you are producing for your baby.  Your belly button may stick out.  You may notice more swelling in your hands,  face, or ankles.  You may have increased tingling or numbness in your hands, arms, and legs. The skin on your belly may also feel numb.  You may feel short of breath because of your expanding uterus.  You may have more problems sleeping. This can be caused by the size of your belly, increased need to urinate, and an increase in your body's metabolism.  You may notice the fetus "dropping," or moving lower in your abdomen (lightening).  You may have increased vaginal discharge.  You may notice your joints feel loose and you may have pain around your pelvic bone. What to expect at prenatal visits You will have prenatal exams every 2 weeks until week 36. Then you will have weekly prenatal exams. During a routine prenatal visit:  You will be weighed to make sure you and the baby are growing normally.  Your blood pressure will be taken.  Your abdomen will be measured to track your baby's growth.  The fetal heartbeat will be listened to.  Any test results from the previous visit will be discussed.  You may have a cervical check near your due date to see if your cervix has softened or thinned (effaced).  You will be tested for Group B streptococcus. This happens between 35 and 37 weeks. Your health care provider may ask you:  What your birth plan is.  How you are feeling.  If you are feeling the baby move.  If you have had any abnormal   symptoms, such as leaking fluid, bleeding, severe headaches, or abdominal cramping.  If you are using any tobacco products, including cigarettes, chewing tobacco, and electronic cigarettes.  If you have any questions. Other tests or screenings that may be performed during your third trimester include:  Blood tests that check for low iron levels (anemia).  Fetal testing to check the health, activity level, and growth of the fetus. Testing is done if you have certain medical conditions or if there are problems during the pregnancy.  Nonstress test  (NST). This test checks the health of your baby to make sure there are no signs of problems, such as the baby not getting enough oxygen. During this test, a belt is placed around your belly. The baby is made to move, and its heart rate is monitored during movement. What is false labor? False labor is a condition in which you feel small, irregular tightenings of the muscles in the womb (contractions) that usually go away with rest, changing position, or drinking water. These are called Braxton Hicks contractions. Contractions may last for hours, days, or even weeks before true labor sets in. If contractions come at regular intervals, become more frequent, increase in intensity, or become painful, you should see your health care provider. What are the signs of labor?  Abdominal cramps.  Regular contractions that start at 10 minutes apart and become stronger and more frequent with time.  Contractions that start on the top of the uterus and spread down to the lower abdomen and back.  Increased pelvic pressure and dull back pain.  A watery or bloody mucus discharge that comes from the vagina.  Leaking of amniotic fluid. This is also known as your "water breaking." It could be a slow trickle or a gush. Let your health care provider know if it has a color or strange odor. If you have any of these signs, call your health care provider right away, even if it is before your due date. Follow these instructions at home: Medicines  Follow your health care provider's instructions regarding medicine use. Specific medicines may be either safe or unsafe to take during pregnancy.  Take a prenatal vitamin that contains at least 600 micrograms (mcg) of folic acid.  If you develop constipation, try taking a stool softener if your health care provider approves. Eating and drinking   Eat a balanced diet that includes fresh fruits and vegetables, whole grains, good sources of protein such as meat, eggs, or tofu,  and low-fat dairy. Your health care provider will help you determine the amount of weight gain that is right for you.  Avoid raw meat and uncooked cheese. These carry germs that can cause birth defects in the baby.  If you have low calcium intake from food, talk to your health care provider about whether you should take a daily calcium supplement.  Eat four or five small meals rather than three large meals a day.  Limit foods that are high in fat and processed sugars, such as fried and sweet foods.  To prevent constipation: ? Drink enough fluid to keep your urine clear or pale yellow. ? Eat foods that are high in fiber, such as fresh fruits and vegetables, whole grains, and beans. Activity  Exercise only as directed by your health care provider. Most women can continue their usual exercise routine during pregnancy. Try to exercise for 30 minutes at least 5 days a week. Stop exercising if you experience uterine contractions.  Avoid heavy lifting.  Do   not exercise in extreme heat or humidity, or at high altitudes.  Wear low-heel, comfortable shoes.  Practice good posture.  You may continue to have sex unless your health care provider tells you otherwise. Relieving pain and discomfort  Take frequent breaks and rest with your legs elevated if you have leg cramps or low back pain.  Take warm sitz baths to soothe any pain or discomfort caused by hemorrhoids. Use hemorrhoid cream if your health care provider approves.  Wear a good support bra to prevent discomfort from breast tenderness.  If you develop varicose veins: ? Wear support pantyhose or compression stockings as told by your healthcare provider. ? Elevate your feet for 15 minutes, 3-4 times a day. Prenatal care  Write down your questions. Take them to your prenatal visits.  Keep all your prenatal visits as told by your health care provider. This is important. Safety  Wear your seat belt at all times when driving.  Make  a list of emergency phone numbers, including numbers for family, friends, the hospital, and police and fire departments. General instructions  Avoid cat litter boxes and soil used by cats. These carry germs that can cause birth defects in the baby. If you have a cat, ask someone to clean the litter box for you.  Do not travel far distances unless it is absolutely necessary and only with the approval of your health care provider.  Do not use hot tubs, steam rooms, or saunas.  Do not drink alcohol.  Do not use any products that contain nicotine or tobacco, such as cigarettes and e-cigarettes. If you need help quitting, ask your health care provider.  Do not use any medicinal herbs or unprescribed drugs. These chemicals affect the formation and growth of the baby.  Do not douche or use tampons or scented sanitary pads.  Do not cross your legs for long periods of time.  To prepare for the arrival of your baby: ? Take prenatal classes to understand, practice, and ask questions about labor and delivery. ? Make a trial run to the hospital. ? Visit the hospital and tour the maternity area. ? Arrange for maternity or paternity leave through employers. ? Arrange for family and friends to take care of pets while you are in the hospital. ? Purchase a rear-facing car seat and make sure you know how to install it in your car. ? Pack your hospital bag. ? Prepare the baby's nursery. Make sure to remove all pillows and stuffed animals from the baby's crib to prevent suffocation.  Visit your dentist if you have not gone during your pregnancy. Use a soft toothbrush to brush your teeth and be gentle when you floss. Contact a health care provider if:  You are unsure if you are in labor or if your water has broken.  You become dizzy.  You have mild pelvic cramps, pelvic pressure, or nagging pain in your abdominal area.  You have lower back pain.  You have persistent nausea, vomiting, or  diarrhea.  You have an unusual or bad smelling vaginal discharge.  You have pain when you urinate. Get help right away if:  Your water breaks before 37 weeks.  You have regular contractions less than 5 minutes apart before 37 weeks.  You have a fever.  You are leaking fluid from your vagina.  You have spotting or bleeding from your vagina.  You have severe abdominal pain or cramping.  You have rapid weight loss or weight gain.  You have   shortness of breath with chest pain.  You notice sudden or extreme swelling of your face, hands, ankles, feet, or legs.  Your baby makes fewer than 10 movements in 2 hours.  You have severe headaches that do not go away when you take medicine.  You have vision changes. Summary  The third trimester is from week 28 through week 40, months 7 through 9. The third trimester is a time when the unborn baby (fetus) is growing rapidly.  During the third trimester, your discomfort may increase as you and your baby continue to gain weight. You may have abdominal, leg, and back pain, sleeping problems, and an increased need to urinate.  During the third trimester your breasts will keep growing and they will continue to become tender. A yellow fluid (colostrum) may leak from your breasts. This is the first milk you are producing for your baby.  False labor is a condition in which you feel small, irregular tightenings of the muscles in the womb (contractions) that eventually go away. These are called Braxton Hicks contractions. Contractions may last for hours, days, or even weeks before true labor sets in.  Signs of labor can include: abdominal cramps; regular contractions that start at 10 minutes apart and become stronger and more frequent with time; watery or bloody mucus discharge that comes from the vagina; increased pelvic pressure and dull back pain; and leaking of amniotic fluid. This information is not intended to replace advice given to you by your  health care provider. Make sure you discuss any questions you have with your health care provider. Document Revised: 02/10/2019 Document Reviewed: 11/25/2016 Elsevier Patient Education  2020 [employer]  

## 2020-10-03 LAB — CBC
Hematocrit: 38 % (ref 34.0–46.6)
Hemoglobin: 13.1 g/dL (ref 11.1–15.9)
MCH: 27 pg (ref 26.6–33.0)
MCHC: 34.5 g/dL (ref 31.5–35.7)
MCV: 78 fL — ABNORMAL LOW (ref 79–97)
Platelets: 214 10*3/uL (ref 150–450)
RBC: 4.86 x10E6/uL (ref 3.77–5.28)
RDW: 13 % (ref 11.7–15.4)
WBC: 7.3 10*3/uL (ref 3.4–10.8)

## 2020-10-03 LAB — HIV ANTIBODY (ROUTINE TESTING W REFLEX): HIV Screen 4th Generation wRfx: NONREACTIVE

## 2020-10-03 LAB — RPR: RPR Ser Ql: NONREACTIVE

## 2020-10-04 ENCOUNTER — Other Ambulatory Visit: Payer: Self-pay | Admitting: *Deleted

## 2020-10-04 ENCOUNTER — Encounter: Payer: Medicaid Other | Attending: Obstetrics and Gynecology | Admitting: Registered"

## 2020-10-04 ENCOUNTER — Encounter: Payer: Self-pay | Admitting: *Deleted

## 2020-10-04 ENCOUNTER — Ambulatory Visit: Payer: Medicaid Other | Admitting: Registered"

## 2020-10-04 ENCOUNTER — Other Ambulatory Visit: Payer: Self-pay

## 2020-10-04 ENCOUNTER — Other Ambulatory Visit: Payer: Self-pay | Admitting: Obstetrics and Gynecology

## 2020-10-04 ENCOUNTER — Ambulatory Visit: Payer: Medicaid Other | Admitting: *Deleted

## 2020-10-04 ENCOUNTER — Ambulatory Visit: Payer: Medicaid Other | Attending: Obstetrics and Gynecology

## 2020-10-04 DIAGNOSIS — O0943 Supervision of pregnancy with grand multiparity, third trimester: Secondary | ICD-10-CM

## 2020-10-04 DIAGNOSIS — Z3A29 29 weeks gestation of pregnancy: Secondary | ICD-10-CM

## 2020-10-04 DIAGNOSIS — O409XX0 Polyhydramnios, unspecified trimester, not applicable or unspecified: Secondary | ICD-10-CM | POA: Diagnosis present

## 2020-10-04 DIAGNOSIS — R030 Elevated blood-pressure reading, without diagnosis of hypertension: Secondary | ICD-10-CM | POA: Diagnosis present

## 2020-10-04 DIAGNOSIS — O0990 Supervision of high risk pregnancy, unspecified, unspecified trimester: Secondary | ICD-10-CM

## 2020-10-04 DIAGNOSIS — O24419 Gestational diabetes mellitus in pregnancy, unspecified control: Secondary | ICD-10-CM | POA: Insufficient documentation

## 2020-10-04 DIAGNOSIS — Z362 Encounter for other antenatal screening follow-up: Secondary | ICD-10-CM | POA: Diagnosis present

## 2020-10-04 DIAGNOSIS — R8271 Bacteriuria: Secondary | ICD-10-CM

## 2020-10-04 DIAGNOSIS — Z3A00 Weeks of gestation of pregnancy not specified: Secondary | ICD-10-CM | POA: Diagnosis not present

## 2020-10-04 DIAGNOSIS — O09293 Supervision of pregnancy with other poor reproductive or obstetric history, third trimester: Secondary | ICD-10-CM

## 2020-10-04 DIAGNOSIS — O09523 Supervision of elderly multigravida, third trimester: Secondary | ICD-10-CM | POA: Diagnosis not present

## 2020-10-04 DIAGNOSIS — O24415 Gestational diabetes mellitus in pregnancy, controlled by oral hypoglycemic drugs: Secondary | ICD-10-CM

## 2020-10-04 DIAGNOSIS — O99210 Obesity complicating pregnancy, unspecified trimester: Secondary | ICD-10-CM

## 2020-10-04 DIAGNOSIS — O99213 Obesity complicating pregnancy, third trimester: Secondary | ICD-10-CM

## 2020-10-04 MED ORDER — BD PEN NEEDLE MICRO U/F 32G X 6 MM MISC: 1.0000 [IU] | 3 refills | Status: DC

## 2020-10-04 MED ORDER — LEVEMIR FLEXTOUCH 100 UNIT/ML ~~LOC~~ SOPN: 15.0000 [IU] | PEN_INJECTOR | Freq: Every day | SUBCUTANEOUS | 11 refills | Status: DC

## 2020-10-04 NOTE — Progress Notes (Signed)
Interpreter service provided by Kallie Edward from [facility]  Insulin Instruction  Patient was seen on 10/04/20 for insulin instruction.   MD orders are: Levemir 15 units daily  The following learning objectives were met by the patient during this visit:   Insulin Action of long acting and short insulins  How to use Levemir Flex Pen  Hygiene and storage  Rotation of Sites  Hypoglycemia- symptoms, causes, treatment choices  Record keeping and MD follow up  Patient demonstrated understanding of insulin administration by return demonstration.  Patient received the following handouts:  Insulin Instruction Handout (in Arabic)                                        Patient to start on insulin as Rx'd by MD  Patient will be seen for follow-up in 1 week or as needed.

## 2020-10-04 NOTE — Patient Instructions (Signed)
15 units of Levemir at bedtime

## 2020-10-11 ENCOUNTER — Other Ambulatory Visit: Payer: Self-pay

## 2020-10-11 ENCOUNTER — Encounter: Payer: Medicaid Other | Admitting: Registered"

## 2020-10-11 ENCOUNTER — Other Ambulatory Visit: Payer: Self-pay | Admitting: Lactation Services

## 2020-10-11 ENCOUNTER — Other Ambulatory Visit (INDEPENDENT_AMBULATORY_CARE_PROVIDER_SITE_OTHER): Payer: Medicaid Other | Admitting: Obstetrics and Gynecology

## 2020-10-11 ENCOUNTER — Ambulatory Visit: Payer: Medicaid Other | Admitting: Registered"

## 2020-10-11 DIAGNOSIS — O24419 Gestational diabetes mellitus in pregnancy, unspecified control: Secondary | ICD-10-CM | POA: Diagnosis not present

## 2020-10-11 MED ORDER — INSULIN LISPRO (1 UNIT DIAL) 100 UNIT/ML (KWIKPEN): 8.0000 [IU] | PEN_INJECTOR | Freq: Three times a day (TID) | SUBCUTANEOUS | 11 refills | Status: DC

## 2020-10-11 NOTE — Progress Notes (Signed)
Spoke with Heywood Bene.  Reviewed current blood sugars.  Fastings all elevated.  Needed meal coverage especially breakfast and lunch.  Increased levamir to 20 units daily from 15 units and now adding humalog R 8 units to each meal.  Blood sugar check in 1 week.

## 2020-10-11 NOTE — Progress Notes (Signed)
Humalog ordered per Heywood Bene, RD.

## 2020-10-11 NOTE — Progress Notes (Signed)
Interpreter services provided by [employer] from [facility]  Patient was seen on 10/11/20 for follow-up assessment after starting insulin on 10/05/20  Current Diabetes Rx: Levemir 15 units daily Metformin 1000 mg bid  Dr. Donavan Foil reviewed patient's chart and increased insulin Rx to: Levemir 20 units daily Humalog 8 units with meals  Review of Log Book shows: FBS: 100-103 mg/dL Breakfast and lunch: [id number];  Dinner: 80-110 mg/dL  Pt states she eats small dinners, may just be yogurt and fruit.  The following learning objectives reviewed during follow-up visit:   Action of long acting and short acting insulin  Patient accurately described how to treat hypoglycemia  Plan:  . Continue Metformin . Increase Levemir to 20 units daily . Add Humalog 8 units with meals . Call the office if you experience frequent hypoglycemia  RD called patient after visit to be sure instructions were clear. Pt voiced correct insulin dosing instructions. [employer] used for call Lesly Dukes [phone]  Patient instructed to monitor glucose levels: FBS: 60 - 95 mg/dl 2 hour: <211 mg/dl  Patient received the following handouts:  Patient Insulin Rx and Dose Instructions with new dose information.  Patient will be seen for follow-up in 1 week or as needed.

## 2020-10-12 ENCOUNTER — Telehealth: Payer: Self-pay | Admitting: Registered"

## 2020-10-12 NOTE — Telephone Encounter (Signed)
RD called patient using [employer] [phone].  RD wanted to clarify how much patient is eating for dinner. RD believes what she is eating would be considered a snack and not a meal. We do not have patients use insulin for snacks. Patinet did not answer. left voicemail that if she is eating just a small amount of food (snack) she does not need to use insulin for that food.

## 2020-10-16 ENCOUNTER — Encounter: Payer: Medicaid Other | Admitting: Obstetrics & Gynecology

## 2020-10-18 ENCOUNTER — Other Ambulatory Visit: Payer: Medicaid Other

## 2020-10-23 ENCOUNTER — Other Ambulatory Visit: Payer: Self-pay

## 2020-10-23 ENCOUNTER — Other Ambulatory Visit: Payer: Self-pay | Admitting: *Deleted

## 2020-10-23 DIAGNOSIS — O99210 Obesity complicating pregnancy, unspecified trimester: Secondary | ICD-10-CM

## 2020-10-23 DIAGNOSIS — O24419 Gestational diabetes mellitus in pregnancy, unspecified control: Secondary | ICD-10-CM

## 2020-10-24 ENCOUNTER — Ambulatory Visit: Payer: Medicaid Other | Admitting: *Deleted

## 2020-10-24 ENCOUNTER — Encounter: Payer: Self-pay | Admitting: *Deleted

## 2020-10-24 ENCOUNTER — Ambulatory Visit: Payer: Medicaid Other | Attending: Obstetrics and Gynecology

## 2020-10-24 ENCOUNTER — Other Ambulatory Visit: Payer: Self-pay

## 2020-10-24 DIAGNOSIS — O409XX0 Polyhydramnios, unspecified trimester, not applicable or unspecified: Secondary | ICD-10-CM

## 2020-10-24 DIAGNOSIS — R8271 Bacteriuria: Secondary | ICD-10-CM | POA: Diagnosis present

## 2020-10-24 DIAGNOSIS — O09293 Supervision of pregnancy with other poor reproductive or obstetric history, third trimester: Secondary | ICD-10-CM

## 2020-10-24 DIAGNOSIS — O0990 Supervision of high risk pregnancy, unspecified, unspecified trimester: Secondary | ICD-10-CM | POA: Insufficient documentation

## 2020-10-24 DIAGNOSIS — Z3A32 32 weeks gestation of pregnancy: Secondary | ICD-10-CM

## 2020-10-24 DIAGNOSIS — O99213 Obesity complicating pregnancy, third trimester: Secondary | ICD-10-CM

## 2020-10-24 DIAGNOSIS — O0943 Supervision of pregnancy with grand multiparity, third trimester: Secondary | ICD-10-CM | POA: Diagnosis not present

## 2020-10-24 DIAGNOSIS — R030 Elevated blood-pressure reading, without diagnosis of hypertension: Secondary | ICD-10-CM | POA: Insufficient documentation

## 2020-10-24 DIAGNOSIS — O09522 Supervision of elderly multigravida, second trimester: Secondary | ICD-10-CM

## 2020-10-24 DIAGNOSIS — O24415 Gestational diabetes mellitus in pregnancy, controlled by oral hypoglycemic drugs: Secondary | ICD-10-CM | POA: Insufficient documentation

## 2020-10-24 DIAGNOSIS — O99210 Obesity complicating pregnancy, unspecified trimester: Secondary | ICD-10-CM

## 2020-10-24 DIAGNOSIS — Z362 Encounter for other antenatal screening follow-up: Secondary | ICD-10-CM

## 2020-10-25 ENCOUNTER — Ambulatory Visit (INDEPENDENT_AMBULATORY_CARE_PROVIDER_SITE_OTHER): Payer: Medicaid Other | Admitting: Obstetrics & Gynecology

## 2020-10-25 ENCOUNTER — Other Ambulatory Visit: Payer: Self-pay

## 2020-10-25 VITALS — BP 127/77 | HR 92 | Wt 244.2 lb

## 2020-10-25 DIAGNOSIS — Z3A32 32 weeks gestation of pregnancy: Secondary | ICD-10-CM

## 2020-10-25 DIAGNOSIS — R030 Elevated blood-pressure reading, without diagnosis of hypertension: Secondary | ICD-10-CM

## 2020-10-25 DIAGNOSIS — M79645 Pain in left finger(s): Secondary | ICD-10-CM

## 2020-10-25 DIAGNOSIS — O24419 Gestational diabetes mellitus in pregnancy, unspecified control: Secondary | ICD-10-CM

## 2020-10-25 DIAGNOSIS — O09529 Supervision of elderly multigravida, unspecified trimester: Secondary | ICD-10-CM

## 2020-10-25 DIAGNOSIS — Z603 Acculturation difficulty: Secondary | ICD-10-CM

## 2020-10-25 DIAGNOSIS — O409XX0 Polyhydramnios, unspecified trimester, not applicable or unspecified: Secondary | ICD-10-CM

## 2020-10-25 DIAGNOSIS — O0990 Supervision of high risk pregnancy, unspecified, unspecified trimester: Secondary | ICD-10-CM

## 2020-10-25 NOTE — Progress Notes (Signed)
PRENATAL VISIT NOTE  Subjective:  Elaine Kelly is a 34 y.o. [id number] at [redacted]w[redacted]d being seen today for ongoing prenatal care.  She is currently monitored for the following issues for this high-risk pregnancy and has Obese; Language barrier, cultural differences; Supervision of high risk pregnancy, antepartum; AMA (advanced maternal age) multigravida 35+; Elevated blood pressure reading in office without diagnosis of hypertension; Obesity in pregnancy; Group B streptococcal bacteriuria; History of postpartum hemorrhage; Gestational diabetes; Polyhydramnios affecting pregnancy; and [redacted] weeks gestation of pregnancy on their problem list.  Patient reports increased fatigue.  Contractions: Not present. Vag. Bleeding: None.  Movement: Present. Denies leaking of fluid.   Pt is on insulin with pregnancy.  Did not bring log with her today.  Saw diabetic educator [phone].  Pt reports her fasting BS are around 100-130 but she is not taking her evening dosage because she often does not eat dinner.  Clarified this with the translator who helped with visit.  Pt skips both Humalog and levemir if doesn't eat dinner.  D/w pt importance of levemir for over night BS coverage.  She is taking the metformin, baby ASA.    The following portions of the patient's history were reviewed and updated as appropriate: allergies, current medications, past family history, past medical history, past social history, past surgical history and problem list.   Has been experiencing exquisite tenderness lateral to nail on left hand.  Very tender to the touch  No trauma.  Has henna on skin, painted on.  Reports hx of having finger shut in door.  Felt like it healed but has experienced recurrent pain in finger.    Objective:   Vitals:   10/25/20 1034  BP: 127/77  Pulse: 92  Weight: 244 lb 3.2 oz (110.8 kg)    Fetal Status: Fetal Heart Rate (bpm): 156   Movement: Present     General:  Alert, oriented and cooperative. Patient is in  no acute distress.  Skin: Skin is warm and dry. No rash noted.   Cardiovascular: Normal heart rate noted  Respiratory: Normal respiratory effort, no problems with respiration noted  Abdomen: Soft, gravid, appropriate for gestational age.  Pain/Pressure: Present     Pelvic: Cervical exam deferred        Extremities: Normal range of motion.  Edema: None  Mental Status: Normal mood and affect. Normal behavior. Normal judgment and thought content.   Musculuskeletal:   Assessment and Plan:  Pregnancy: G7P5015 at [redacted]w[redacted]d 1. [redacted] weeks gestation of pregnancy - continue PNV and baby ASA  2. Supervision of high risk pregnancy, antepartum - having weekly BPPs - growth scan scheduled 11/01/2020  3. Antepartum multigravida of advanced maternal age  38. Elevated blood pressure reading in office without diagnosis of hypertension  5. Language barrier, cultural differences - translator used throughout visit  6. Gestational diabetes mellitus (GDM), antepartum, gestational diabetes method of control unspecified - Reviewed importance of bringing log with her and importance of Levemir insulin  7. Polyhydramnios affecting pregnancy  8.  Finger pain after trauma.   - Referral will be made.  May need to wait until post partum   Preterm labor symptoms and general obstetric precautions including but not limited to vaginal bleeding, contractions, leaking of fluid and fetal movement were reviewed in detail with the patient. Please refer to After Visit Summary for other counseling recommendations.    Future Appointments  Date Time Provider Department Center  11/01/2020 10:45 AM [facility] NURSE [facility]  11/01/2020 11:00 AM [facility]  US1 [facility]  11/07/2020 11:15 AM [facility] NURSE [facility]  11/07/2020 11:30 AM [facility] US3 [facility]     Jerene Bears, MD

## 2020-10-25 NOTE — Progress Notes (Signed)
States she takes humalog twice a day instead of three times a day because she doesn't eat dinner. States she forgot her log . States she ran out of strips. I explained she has 11 refills at her [facility] pharmacy and she just needs to go there and tell them she needs refill.  Nethan Caudillo,RN

## 2020-11-01 ENCOUNTER — Other Ambulatory Visit: Payer: Self-pay | Admitting: *Deleted

## 2020-11-01 ENCOUNTER — Encounter: Payer: Self-pay | Admitting: *Deleted

## 2020-11-01 ENCOUNTER — Ambulatory Visit: Payer: Medicaid Other | Attending: Obstetrics and Gynecology

## 2020-11-01 ENCOUNTER — Ambulatory Visit: Payer: Medicaid Other | Admitting: *Deleted

## 2020-11-01 ENCOUNTER — Other Ambulatory Visit: Payer: Self-pay

## 2020-11-01 DIAGNOSIS — O409XX0 Polyhydramnios, unspecified trimester, not applicable or unspecified: Secondary | ICD-10-CM

## 2020-11-01 DIAGNOSIS — O0990 Supervision of high risk pregnancy, unspecified, unspecified trimester: Secondary | ICD-10-CM | POA: Insufficient documentation

## 2020-11-01 DIAGNOSIS — O24414 Gestational diabetes mellitus in pregnancy, insulin controlled: Secondary | ICD-10-CM | POA: Diagnosis not present

## 2020-11-01 DIAGNOSIS — O99210 Obesity complicating pregnancy, unspecified trimester: Secondary | ICD-10-CM

## 2020-11-01 DIAGNOSIS — R8271 Bacteriuria: Secondary | ICD-10-CM | POA: Diagnosis present

## 2020-11-01 DIAGNOSIS — O403XX0 Polyhydramnios, third trimester, not applicable or unspecified: Secondary | ICD-10-CM

## 2020-11-01 DIAGNOSIS — Z3A33 33 weeks gestation of pregnancy: Secondary | ICD-10-CM

## 2020-11-01 DIAGNOSIS — O24415 Gestational diabetes mellitus in pregnancy, controlled by oral hypoglycemic drugs: Secondary | ICD-10-CM | POA: Insufficient documentation

## 2020-11-01 DIAGNOSIS — O0943 Supervision of pregnancy with grand multiparity, third trimester: Secondary | ICD-10-CM

## 2020-11-01 DIAGNOSIS — O09522 Supervision of elderly multigravida, second trimester: Secondary | ICD-10-CM

## 2020-11-01 DIAGNOSIS — O99213 Obesity complicating pregnancy, third trimester: Secondary | ICD-10-CM

## 2020-11-01 DIAGNOSIS — R030 Elevated blood-pressure reading, without diagnosis of hypertension: Secondary | ICD-10-CM

## 2020-11-01 DIAGNOSIS — O09523 Supervision of elderly multigravida, third trimester: Secondary | ICD-10-CM

## 2020-11-01 DIAGNOSIS — O09293 Supervision of pregnancy with other poor reproductive or obstetric history, third trimester: Secondary | ICD-10-CM

## 2020-11-01 DIAGNOSIS — Z362 Encounter for other antenatal screening follow-up: Secondary | ICD-10-CM

## 2020-11-03 NOTE — L&D Delivery Note (Signed)
OB/GYN Faculty Practice Delivery Note  Elaine Kelly is a 35 y.o. [id number] s/p SVD at [redacted]w[redacted]d. She was admitted for SOL with SROM.   ROM: 1h 7m with mec stained fluid GBS Status: positive Maximum Maternal Temperature: 98.1  Labor Progress: . Per FOB, pt's labor started prior to bedtime around 2130 but pt did not feel contractions were strong enough to come to the hospital. She woke up him with strong contractions around 2:45. Pt had strong urge to push as they pulled up to the hospital.  Delivery Date/Time: 11/20/20 at 0335 Delivery: Called to MAU room and patient was complete but able to breathe through contractions without pushing. Decision made to transport to L&D. Once in room, pt pushed one time and delivered baby and intact placenta at the same time with very little bleeding or amniotic fluid. No nuchal cord present. Infant covered in thick meconium, but with spontaneous cry once stimulated and suctioned on mother's abdomen. Cord clamped x 2 after 2-minute delay, and cut by myself. Not enough blood in cord for cord blood to be obtained. Fundus firm with massage and Pitocin. Labia, perineum, vagina, and cervix inspected, 1st degree midline laceration found but hemostatic so no repair needed.  Placenta: spontaneous, intact with birth of infant - sent to pathology Complications: thick  meconium Lacerations: 1st degree EBL: 50 Analgesia: none  Postpartum Planning [x]  transfer orders to MB [x]  discharge summary started & shared [x]  message to sent to schedule follow-up  [x]  lists updated [x]  vaccines UTD  Infant: Girl  APGARs 7/9  pending  , CNM, IBCLC Certified Nurse Midwife, [facility] for , [facility] 11/20/2020, 4:09 AM

## 2020-11-07 ENCOUNTER — Other Ambulatory Visit: Payer: Self-pay

## 2020-11-07 ENCOUNTER — Ambulatory Visit: Payer: Medicaid Other | Attending: Obstetrics and Gynecology

## 2020-11-07 ENCOUNTER — Ambulatory Visit: Payer: Medicaid Other | Admitting: *Deleted

## 2020-11-07 DIAGNOSIS — O24415 Gestational diabetes mellitus in pregnancy, controlled by oral hypoglycemic drugs: Secondary | ICD-10-CM | POA: Diagnosis present

## 2020-11-07 DIAGNOSIS — O0990 Supervision of high risk pregnancy, unspecified, unspecified trimester: Secondary | ICD-10-CM

## 2020-11-07 DIAGNOSIS — O99213 Obesity complicating pregnancy, third trimester: Secondary | ICD-10-CM | POA: Diagnosis not present

## 2020-11-07 DIAGNOSIS — O409XX0 Polyhydramnios, unspecified trimester, not applicable or unspecified: Secondary | ICD-10-CM | POA: Diagnosis present

## 2020-11-07 DIAGNOSIS — O09522 Supervision of elderly multigravida, second trimester: Secondary | ICD-10-CM

## 2020-11-07 DIAGNOSIS — R030 Elevated blood-pressure reading, without diagnosis of hypertension: Secondary | ICD-10-CM | POA: Diagnosis present

## 2020-11-07 DIAGNOSIS — O99210 Obesity complicating pregnancy, unspecified trimester: Secondary | ICD-10-CM

## 2020-11-07 DIAGNOSIS — R8271 Bacteriuria: Secondary | ICD-10-CM

## 2020-11-07 DIAGNOSIS — O403XX0 Polyhydramnios, third trimester, not applicable or unspecified: Secondary | ICD-10-CM

## 2020-11-07 DIAGNOSIS — O0943 Supervision of pregnancy with grand multiparity, third trimester: Secondary | ICD-10-CM

## 2020-11-07 DIAGNOSIS — Z362 Encounter for other antenatal screening follow-up: Secondary | ICD-10-CM

## 2020-11-07 DIAGNOSIS — O09293 Supervision of pregnancy with other poor reproductive or obstetric history, third trimester: Secondary | ICD-10-CM

## 2020-11-07 DIAGNOSIS — Z3A34 34 weeks gestation of pregnancy: Secondary | ICD-10-CM

## 2020-11-08 ENCOUNTER — Ambulatory Visit (INDEPENDENT_AMBULATORY_CARE_PROVIDER_SITE_OTHER): Payer: Medicaid Other | Admitting: Obstetrics & Gynecology

## 2020-11-08 VITALS — BP 114/81 | HR 85 | Wt 242.8 lb

## 2020-11-08 DIAGNOSIS — O409XX0 Polyhydramnios, unspecified trimester, not applicable or unspecified: Secondary | ICD-10-CM

## 2020-11-08 DIAGNOSIS — O24419 Gestational diabetes mellitus in pregnancy, unspecified control: Secondary | ICD-10-CM

## 2020-11-08 DIAGNOSIS — O0990 Supervision of high risk pregnancy, unspecified, unspecified trimester: Secondary | ICD-10-CM

## 2020-11-08 DIAGNOSIS — Z3A34 34 weeks gestation of pregnancy: Secondary | ICD-10-CM

## 2020-11-08 LAB — POCT URINALYSIS DIP (DEVICE)
Bilirubin Urine: NEGATIVE
Glucose, UA: NEGATIVE mg/dL
Hgb urine dipstick: NEGATIVE
Ketones, ur: NEGATIVE mg/dL
Nitrite: NEGATIVE
Protein, ur: NEGATIVE mg/dL
Specific Gravity, Urine: >=1.03 (ref 1.005–1.030)
Urobilinogen, UA: 0.2 mg/dL (ref 0.0–1.0)
pH: 6 (ref 5.0–8.0)

## 2020-11-08 MED ORDER — ACCU-CHEK SOFTCLIX LANCETS MISC: 12 refills | Status: DC

## 2020-11-08 MED ORDER — PREPLUS 27-1 MG PO TABS: 1.0000 | ORAL_TABLET | Freq: Every day | ORAL | 13 refills | Status: DC

## 2020-11-08 MED ORDER — INSULIN LISPRO (1 UNIT DIAL) 100 UNIT/ML (KWIKPEN): PEN_INJECTOR | SUBCUTANEOUS | 3 refills | Status: DC

## 2020-11-08 NOTE — Progress Notes (Signed)
PRENATAL VISIT NOTE  Subjective:  Elaine Kelly is a 35 y.o. F6O1308 at [redacted]w[redacted]d being seen today for ongoing prenatal care.  She is currently monitored for the following issues for this high-risk pregnancy and has Obese; Language barrier, cultural differences; Supervision of high risk pregnancy, antepartum; AMA (advanced maternal age) multigravida 35+; Elevated blood pressure reading in office without diagnosis of hypertension; Obesity in pregnancy; Group B streptococcal bacteriuria; History of postpartum hemorrhage; Gestational diabetes; Polyhydramnios affecting pregnancy; and [redacted] weeks gestation of pregnancy on their problem list.  Patient reports no complaints.  Contractions: Irritability. Vag. Bleeding: None.  Movement: Present. Denies leaking of fluid.   States she's run out of lancets and she needs RF.  Epic shows there should be RFs but rx completed today.  Fasting BSs are 90-105.  2 hour post prandial breakfast BSs are 120-140's.  2 hour PP lunch BS are 120-150's.  She isn't checking evening BSs as her "meals" are very small like a cheese sandwich or piece of fruit.  She is taking her insulin 4 times daily.  Levimir at night and humolog with meals.  Ultrasound from yesterday reviewed.  AFI 28cm.  BPP 8/8.  The following portions of the patient's history were reviewed and updated as appropriate: allergies, current medications, past family history, past medical history, past social history, past surgical history and problem list.   Objective:   Vitals:   11/08/20 1052  BP: 114/81  Pulse: 85  Weight: 242 lb 12.8 oz (110.1 kg)    Fetal Status: Fetal Heart Rate (bpm): 154   Movement: Present     General:  Alert, oriented and cooperative. Patient is in no acute distress.  Skin: Skin is warm and dry. No rash noted.   Cardiovascular: Normal heart rate noted  Respiratory: Normal respiratory effort, no problems with respiration noted  Abdomen: Soft, gravid, appropriate for gestational  age.  Pain/Pressure: Present     Pelvic: Cervical exam deferred        Extremities: Normal range of motion.  Edema: Trace  Mental Status: Normal mood and affect. Normal behavior. Normal judgment and thought content.   Assessment and Plan:  Pregnancy: G7P5015 at [redacted]w[redacted]d 1. [redacted] weeks gestation of pregnancy - Continue PNVs  2. Supervision of high risk pregnancy, antepartum - Prenatal Vit-Fe Fumarate-FA (PREPLUS) 27-1 MG TABS; Take 1 tablet by mouth daily.  Dispense: 30 tablet; Refill: 13  3. Polyhydramnios affecting pregnancy   4. Gestational diabetes mellitus (GDM), antepartum, gestational diabetes method of control unspecified - Continue metformin 1000mg  bid. - increase humalog to 10 units with breakfast and lunch.  Encouraged pt to start taking dinner postprandial BSs to see if needs to add evening insulin. - Accu-Chek Softclix Lancets lancets; Use as instructed. Please check blood glucose 4 times daily.  Dispense: 100 each; Refill: 12 -Recheck 1 week with provider or , RD -If sees diabetic education next week, follow up 2 weeks weeks appt already scheduled - Continue weekly BPPs - Has growth scan 3 weeks.    5. Pain in finger - will need ortho referral after delivery  Preterm labor symptoms and general obstetric precautions including but not limited to vaginal bleeding, contractions, leaking of fluid and fetal movement were reviewed in detail with the patient. Please refer to After Visit Summary for other counseling recommendations.   Return in about 1 week (around 11/15/2020).  Future Appointments  Date Time Provider Department Center  11/14/2020 10:30 AM [facility] NURSE [facility]  11/14/2020 10:45 AM [facility]  NST [facility]  11/15/2020 10:15 AM [facility]  11/22/2020  9:55 AM Jerene Bears, MD [facility]  11/23/2020 11:15 AM [facility] NURSE [facility]  11/23/2020 11:30 AM [facility] US3 [facility]  11/29/2020  9:55 AM Jerene Bears, MD [facility]   11/29/2020 11:00 AM [facility] NURSE [facility]  11/29/2020 11:15 AM [facility] US2 [facility]  12/07/2020  9:55 AM Jerene Bears, MD [facility]  12/14/2020  9:55 AM Crissie Reese, Mar Walmer Sella, MD [facility]    Jerene Bears, MD

## 2020-11-14 ENCOUNTER — Ambulatory Visit: Payer: Medicaid Other | Attending: Obstetrics and Gynecology

## 2020-11-14 ENCOUNTER — Ambulatory Visit: Payer: Medicaid Other

## 2020-11-15 ENCOUNTER — Ambulatory Visit: Payer: Medicaid Other | Admitting: Registered"

## 2020-11-15 ENCOUNTER — Encounter: Payer: Medicaid Other | Attending: Obstetrics and Gynecology | Admitting: Registered"

## 2020-11-15 ENCOUNTER — Other Ambulatory Visit: Payer: Self-pay | Admitting: General Practice

## 2020-11-15 ENCOUNTER — Other Ambulatory Visit: Payer: Self-pay

## 2020-11-15 DIAGNOSIS — Z3A00 Weeks of gestation of pregnancy not specified: Secondary | ICD-10-CM | POA: Insufficient documentation

## 2020-11-15 DIAGNOSIS — O24414 Gestational diabetes mellitus in pregnancy, insulin controlled: Secondary | ICD-10-CM

## 2020-11-15 DIAGNOSIS — O24419 Gestational diabetes mellitus in pregnancy, unspecified control: Secondary | ICD-10-CM | POA: Insufficient documentation

## 2020-11-15 DIAGNOSIS — O99210 Obesity complicating pregnancy, unspecified trimester: Secondary | ICD-10-CM | POA: Diagnosis not present

## 2020-11-15 MED ORDER — INSULIN LISPRO (1 UNIT DIAL) 100 UNIT/ML (KWIKPEN): PEN_INJECTOR | SUBCUTANEOUS | 3 refills | Status: DC

## 2020-11-15 NOTE — Progress Notes (Signed)
In-person Interpreter services provided by Elaine Kelly from [facility]  Patient was seen on 11/15/20 for follow-up assessment and education for Gestational Diabetes. [redacted]w[redacted]d.   Current Insulin Rx as of 11/08/2020: Novolog 10 units breakfast and lunch; 8 units dinner (patient using 10 units tid) Levemir 15 units 1x/day (patient using 16 units)  Change Insulin Rx dose: (Per Dr. Donavan Foil) Novolog: keep at 10 units per meal. If dinner blood sugar starts decreasing to risk of low blood sugar with this change to Levemir, reduce dinner novolog to 8 units Levemir to 18 units per day    The following learning objectives reviewed during follow-up visit:   Action and timing of Novolog   Action and timing of Levemir  Plan:  . Inject Novolog at start of meal . Inject Levemir at same time each day   Patient instructed to monitor glucose levels: FBS: 60 - 95 mg/dl 2 hour: <502 mg/dl  Patient received the following handouts:  none  Patient will be seen for follow-up as needed.

## 2020-11-20 ENCOUNTER — Inpatient Hospital Stay (HOSPITAL_COMMUNITY)
Admission: AD | Admit: 2020-11-20 | Discharge: 2020-11-22 | DRG: 805 | Disposition: A | Discharge status: Home / Self Care | Payer: Medicaid Other | Attending: Obstetrics & Gynecology | Admitting: Obstetrics & Gynecology

## 2020-11-20 ENCOUNTER — Other Ambulatory Visit: Payer: Self-pay

## 2020-11-20 ENCOUNTER — Encounter (HOSPITAL_COMMUNITY): Payer: Self-pay | Admitting: Obstetrics & Gynecology

## 2020-11-20 DIAGNOSIS — O99824 Streptococcus B carrier state complicating childbirth: Secondary | ICD-10-CM

## 2020-11-20 DIAGNOSIS — Z20822 Contact with and (suspected) exposure to covid-19: Secondary | ICD-10-CM | POA: Diagnosis present

## 2020-11-20 DIAGNOSIS — O24424 Gestational diabetes mellitus in childbirth, insulin controlled: Secondary | ICD-10-CM | POA: Diagnosis present

## 2020-11-20 DIAGNOSIS — O99214 Obesity complicating childbirth: Secondary | ICD-10-CM | POA: Diagnosis present

## 2020-11-20 DIAGNOSIS — O4593 Premature separation of placenta, unspecified, third trimester: Secondary | ICD-10-CM | POA: Diagnosis present

## 2020-11-20 DIAGNOSIS — O09529 Supervision of elderly multigravida, unspecified trimester: Secondary | ICD-10-CM

## 2020-11-20 DIAGNOSIS — Z30017 Encounter for initial prescription of implantable subdermal contraceptive: Secondary | ICD-10-CM

## 2020-11-20 DIAGNOSIS — O24419 Gestational diabetes mellitus in pregnancy, unspecified control: Secondary | ICD-10-CM | POA: Diagnosis present

## 2020-11-20 DIAGNOSIS — O409XX0 Polyhydramnios, unspecified trimester, not applicable or unspecified: Secondary | ICD-10-CM | POA: Diagnosis present

## 2020-11-20 DIAGNOSIS — Z3A36 36 weeks gestation of pregnancy: Secondary | ICD-10-CM

## 2020-11-20 DIAGNOSIS — O403XX0 Polyhydramnios, third trimester, not applicable or unspecified: Secondary | ICD-10-CM | POA: Diagnosis present

## 2020-11-20 DIAGNOSIS — O99210 Obesity complicating pregnancy, unspecified trimester: Secondary | ICD-10-CM | POA: Diagnosis present

## 2020-11-20 DIAGNOSIS — O26893 Other specified pregnancy related conditions, third trimester: Secondary | ICD-10-CM | POA: Diagnosis present

## 2020-11-20 DIAGNOSIS — Z603 Acculturation difficulty: Secondary | ICD-10-CM

## 2020-11-20 LAB — TYPE AND SCREEN
ABO/RH(D): A POS
Antibody Screen: NEGATIVE

## 2020-11-20 LAB — RESP PANEL BY RT-PCR (FLU A&B, COVID) ARPGX2
Influenza A by PCR: NEGATIVE
Influenza B by PCR: NEGATIVE
SARS Coronavirus 2 by RT PCR: NEGATIVE

## 2020-11-20 LAB — CBC
HCT: 42.8 % (ref 36.0–46.0)
Hemoglobin: 13.8 g/dL (ref 12.0–15.0)
MCH: 26.3 pg (ref 26.0–34.0)
MCHC: 32.2 g/dL (ref 30.0–36.0)
MCV: 81.5 fL (ref 80.0–100.0)
Platelets: 207 10*3/uL (ref 150–400)
RBC: 5.25 MIL/uL — ABNORMAL HIGH (ref 3.87–5.11)
RDW: 14.2 % (ref 11.5–15.5)
WBC: 13.5 10*3/uL — ABNORMAL HIGH (ref 4.0–10.5)
nRBC: 0 % (ref 0.0–0.2)

## 2020-11-20 LAB — RPR: RPR Ser Ql: NONREACTIVE

## 2020-11-20 LAB — GLUCOSE, CAPILLARY: Glucose-Capillary: 98 mg/dL (ref 70–99)

## 2020-11-20 MED ORDER — LACTATED RINGERS IV SOLN: 500.0000 mL | INTRAVENOUS | Status: DC | PRN

## 2020-11-20 MED ORDER — OXYTOCIN BOLUS FROM INFUSION
333.0000 mL | Freq: Once | INTRAVENOUS | Status: AC
  Administered 2020-11-20: 333 mL via INTRAVENOUS

## 2020-11-20 MED ORDER — SIMETHICONE 80 MG PO CHEW: 80.0000 mg | CHEWABLE_TABLET | ORAL | Status: DC | PRN

## 2020-11-20 MED ORDER — BENZOCAINE-MENTHOL 20-0.5 % EX AERO
1.0000 "application " | INHALATION_SPRAY | CUTANEOUS | Status: DC | PRN
  Administered 2020-11-20: 1 via TOPICAL
  Filled 2020-11-20: qty 56

## 2020-11-20 MED ORDER — ONDANSETRON HCL 4 MG/2ML IJ SOLN: 4.0000 mg | INTRAMUSCULAR | Status: DC | PRN

## 2020-11-20 MED ORDER — ACETAMINOPHEN 325 MG PO TABS: 650.0000 mg | ORAL_TABLET | ORAL | Status: DC | PRN

## 2020-11-20 MED ORDER — PRENATAL MULTIVITAMIN CH
1.0000 | ORAL_TABLET | Freq: Every day | ORAL | Status: DC
  Administered 2020-11-20 – 2020-11-22 (×3): 1 via ORAL
  Filled 2020-11-20 (×3): qty 1

## 2020-11-20 MED ORDER — SENNOSIDES-DOCUSATE SODIUM 8.6-50 MG PO TABS
2.0000 | ORAL_TABLET | ORAL | Status: DC
  Administered 2020-11-20 – 2020-11-22 (×3): 2 via ORAL
  Filled 2020-11-20 (×3): qty 2

## 2020-11-20 MED ORDER — OXYTOCIN-SODIUM CHLORIDE 30-0.9 UT/500ML-% IV SOLN: 2.5000 [IU]/h | INTRAVENOUS | Status: DC

## 2020-11-20 MED ORDER — OXYCODONE-ACETAMINOPHEN 5-325 MG PO TABS: 1.0000 | ORAL_TABLET | ORAL | Status: DC | PRN

## 2020-11-20 MED ORDER — WITCH HAZEL-GLYCERIN EX PADS: 1.0000 "application " | MEDICATED_PAD | CUTANEOUS | Status: DC | PRN

## 2020-11-20 MED ORDER — ONDANSETRON HCL 4 MG/2ML IJ SOLN: 4.0000 mg | Freq: Four times a day (QID) | INTRAMUSCULAR | Status: DC | PRN

## 2020-11-20 MED ORDER — DIBUCAINE (PERIANAL) 1 % EX OINT: 1.0000 "application " | TOPICAL_OINTMENT | CUTANEOUS | Status: DC | PRN

## 2020-11-20 MED ORDER — DIPHENHYDRAMINE HCL 25 MG PO CAPS: 25.0000 mg | ORAL_CAPSULE | Freq: Four times a day (QID) | ORAL | Status: DC | PRN

## 2020-11-20 MED ORDER — OXYTOCIN-SODIUM CHLORIDE 30-0.9 UT/500ML-% IV SOLN
INTRAVENOUS | Status: AC
  Filled 2020-11-20: qty 500

## 2020-11-20 MED ORDER — ZOLPIDEM TARTRATE 5 MG PO TABS: 5.0000 mg | ORAL_TABLET | Freq: Every evening | ORAL | Status: DC | PRN

## 2020-11-20 MED ORDER — ONDANSETRON HCL 4 MG PO TABS: 4.0000 mg | ORAL_TABLET | ORAL | Status: DC | PRN

## 2020-11-20 MED ORDER — TETANUS-DIPHTH-ACELL PERTUSSIS 5-2.5-18.5 LF-MCG/0.5 IM SUSY: 0.5000 mL | PREFILLED_SYRINGE | Freq: Once | INTRAMUSCULAR | Status: DC

## 2020-11-20 MED ORDER — IBUPROFEN 600 MG PO TABS
600.0000 mg | ORAL_TABLET | Freq: Four times a day (QID) | ORAL | Status: DC
  Administered 2020-11-20 – 2020-11-22 (×10): 600 mg via ORAL
  Filled 2020-11-20 (×9): qty 1

## 2020-11-20 MED ORDER — LIDOCAINE HCL (PF) 1 % IJ SOLN: 30.0000 mL | INTRAMUSCULAR | Status: DC | PRN

## 2020-11-20 MED ORDER — COCONUT OIL OIL: 1.0000 "application " | TOPICAL_OIL | Status: DC | PRN

## 2020-11-20 MED ORDER — SOD CITRATE-CITRIC ACID 500-334 MG/5ML PO SOLN: 30.0000 mL | ORAL | Status: DC | PRN

## 2020-11-20 MED ORDER — LACTATED RINGERS IV SOLN: INTRAVENOUS | Status: DC

## 2020-11-20 MED ORDER — OXYCODONE-ACETAMINOPHEN 5-325 MG PO TABS: 2.0000 | ORAL_TABLET | ORAL | Status: DC | PRN

## 2020-11-20 NOTE — Progress Notes (Signed)
Pt rolled onto L&D at 0334 from MAU accompanied by Edd Arbour, CNM-MAU provider. Delivery of viable female infant at 72. External fetal monitors applied a soon as pt was put into bed but unable to obtain fetal heart tones due to pt unable to sit still and involuntarily pushing.

## 2020-11-20 NOTE — Progress Notes (Signed)
Post Partum Day 0 Subjective: no complaints, up ad lib, voiding, tolerating PO and + flatus, baby has fed twice since delivery.   Objective: Blood pressure 120/71, pulse 91, temperature 98.4 F (36.9 C), temperature source Oral, resp. rate 15, height 5\' 7"  (1.702 m), weight 244 lb (110.7 kg), last menstrual period 03/03/2020, SpO2 98 %, unknown if currently breastfeeding.  Physical Exam:  General: alert, cooperative, appears stated age, fatigued and no distress Lochia: appropriate Uterine Fundus: firm Incision: n/a DVT Evaluation: No evidence of DVT seen on physical exam.  Recent Labs    11/20/20 0325  HGB 13.8  HCT 42.8    Assessment/Plan: Routine postpartum care Breastfeeding Nexplanon placement tomorrow or prior to discharge   LOS: 0 days   11/22/20 11/20/2020, 7:48 AM

## 2020-11-20 NOTE — H&P (Signed)
LABOR AND DELIVERY ADMISSION HISTORY AND PHYSICAL NOTE  Elaine Kelly is a 35 y.o. female D7O2423 with IUP at 9w3dby LMP presenting for SOL.  [facility]:  - AMA - GDM, on insulin  - Polyhydramnios - History of PPH  - Obesity  - Language barrier  - GBS bacteruria   Past Medical History: Past Medical History:  Diagnosis Date  . Diabetes mellitus   . Gestational diabetes   . Postpartum hemorrhage   . [place]     Past Surgical History: Past Surgical History:  Procedure Laterality Date  . NO PAST SURGERIES      Obstetrical History: OB History    Gravida  7   Para  5   Term  5   Preterm  0   AB  1   Living  5     SAB  1   IAB  0   Ectopic  0   Multiple  0   Live Births  5           Social History: Social History   Socioeconomic History  . Marital status: Married    Spouse name: Not on file  . Number of children: Not on file  . Years of education: Not on file  . Highest education level: Not on file  Occupational History  . Not on file  Tobacco Use  . Smoking status: Never Smoker  . Smokeless tobacco: Never Used  Vaping Use  . Vaping Use: Never used  Substance and Sexual Activity  . Alcohol use: No  . Drug use: No  . Sexual activity: Yes    Birth control/protection: None  Other Topics Concern  . Not on file  Social History Narrative   ** Merged History Encounter **       Social Determinants of Health   Financial Resource Strain: Not on file  Food Insecurity: No Food Insecurity  . Worried About RCharity fundraiserin the Last Year: Never true  . Ran Out of Food in the Last Year: Never true  Transportation Needs: No Transportation Needs  . Lack of Transportation (Medical): No  . Lack of Transportation (Non-Medical): No  Physical Activity: Not on file  Stress: Not on file  Social Connections: Not on file    Family History: Family History  Problem  Relation Age of Onset  . Hypertension Father   . Cancer Maternal Aunt        leaukemia  . Diabetes Paternal Grandmother   . Diabetes Mother   . Hypertension Mother   . Hyperlipidemia Mother     Allergies: No Known Allergies  Medications Prior to Admission  Medication Sig Dispense Refill Last Dose  . Accu-Chek Softclix Lancets lancets Use as instructed. Please check blood glucose 4 times daily. 100 each 12   . acetaminophen (TYLENOL) 500 MG tablet Take 500 mg by mouth every 6 (six) hours as needed.     .Marland Kitchenaspirin EC 81 MG tablet Take 1 tablet (81 mg total) by mouth daily. Swallow whole. 30 tablet 5   . Blood Pressure Monitoring (BLOOD PRESSURE KIT) DEVI 1 Device by Does not apply route as needed. 1 each 0   . Butalbital-APAP-Caffeine 50-325-40 MG capsule Take 1-2 capsules by mouth every 6 (six) hours as needed for headache. (Patient not taking: No sig reported) 30 capsule 3   . Elastic Bandages & Supports (COMFORT FIT MATERNITY SUPP LG) MISC 1 Units by Does not apply  route daily. 1 each 0   . glucose blood (ACCU-CHEK GUIDE) test strip Use as instructed. Please check blood glucose 4 times daily. 100 each 12   . insulin detemir (LEVEMIR FLEXTOUCH) 100 UNIT/ML FlexPen Inject 15 Units into the skin daily. 15 mL 11   . insulin lispro (HUMALOG KWIKPEN) 100 UNIT/ML KwikPen Inject 10 units at breakfast and lunch and 8 units at dinner. 15 mL 3   . Insulin Pen Needle (BD PEN NEEDLE MICRO U/F) 32G X 6 MM MISC 1 Units by Does not apply route as directed. (Patient not taking: Reported on 11/08/2020) 100 each 3   . metFORMIN (GLUCOPHAGE) 500 MG tablet Take 2 tablets (1,000 mg total) by mouth 2 (two) times daily with a meal. 60 tablet 3   . Prenatal Vit-Fe Fumarate-FA (PREPLUS) 27-1 MG TABS Take 1 tablet by mouth daily. 30 tablet 13      Review of Systems  All systems reviewed and negative except as stated in HPI  Physical Exam Blood pressure (!) 111/59, pulse 83, temperature 98.1 F (36.7 C),  temperature source Oral, resp. rate 18, height _0  (1.702 m), weight 110.7 kg, last menstrual period 03/03/2020, unknown if currently breastfeeding. General appearance: alert and cooperative Lungs: clear to auscultation bilaterally Heart: regular rate and rhythm Abdomen: soft, non-tender; bowel sounds normal Extremities: No calf swelling or tenderness Presentation: cephalic Fetal monitoring: 116 Dilation: Lip/rim Exam by:: JAMILLA, CNM  Prenatal labs: ABO, Rh: A/Positive/-- (09/22 1644) Antibody: Negative (09/22 1644) Rubella: 20.20 (09/22 1644) RPR: Non Reactive (11/30 1420)  HBsAg: Negative (09/22 1644)  HIV: Non Reactive (11/30 1420)  GC/Chlamydia: pending GBS:   Positive 2 hr Glucola: [phone] Genetic screening:  Low risk  Anatomy US: female, persistent right umbilical vein   Nursing Staff Provider  Office Location [facility] Dating  LMP  Language  Arabic Anatomy US  WNL execept for persistent umb vein. Polyhydramnios at [redacted]w[redacted]d  Flu Vaccine  08/06/20 Genetic Screen  NIPS: low risk- female  AFP: Negative    Carrier screening: Neg  TDaP Vaccine  09/17/20  Hgb A1C or  GTT Early  Third trimester   COVID Vaccine Moderna 09/06/20, 10/10/20   LAB RESULTS   Rhogam  NA Blood Type A/Positive/-- (09/22 1644)   Feeding Plan Breast / bottle Antibody Negative (09/22 1644)  Contraception Nexplanon Rubella 20.20 (09/22 1644)  Circumcision NA RPR Non Reactive (09/22 1644)   Pediatrician  List given HBsAg Negative (09/22 1644)   Support Person Husband or Mother HCVAb  Negative  Prenatal Classes  too late HIV Non Reactive (09/22 1644)     BTL Consent NA GBS   (For PCN allergy, check sensitivities)   VBAC Consent NA Pap     Hgb Electro    BP Cuff Has BP cuff CF  neg    SMA  neg    Waterbirth  _1  Class _2  Consent _3  CNM visit    Induction  _4  Orders Entered _5 Foley Y/N     Prenatal Transfer Tool  Maternal Diabetes: Yes:  Diabetes Type:  Insulin/Medication controlled Genetic  Screening: Normal Maternal Ultrasounds/Referrals: Other: Umbilical vein and polyhydramnios Fetal Ultrasounds or other Referrals:  Referred to Materal Fetal Medicine  Maternal Substance Abuse:  No Significant Maternal Medications:  Meds include: Other: Insulin Significant Maternal Lab Results: Group B Strep positive  Results for orders placed or performed during the hospital encounter of 11/20/20 (from the past 24 hour(s))  CBC   Collection Time: 11/20/20  3:25 AM  Result Value Ref Range   WBC 13.5 (H) 4.0 - 10.5 K/uL   RBC 5.25 (H) 3.87 - 5.11 MIL/uL   Hemoglobin 13.8 12.0 - 15.0 g/dL   HCT 42.8 36.0 - 46.0 %   MCV 81.5 80.0 - 100.0 fL   MCH 26.3 26.0 - 34.0 pg   MCHC 32.2 30.0 - 36.0 g/dL   RDW 14.2 11.5 - 15.5 %   Platelets 207 150 - 400 K/uL   nRBC 0.0 0.0 - 0.2 %    Patient Active Problem List   Diagnosis Date Noted  . Indication for care in labor or delivery 11/20/2020  . SVD (spontaneous vaginal delivery) 11/20/2020  . [redacted] weeks gestation of pregnancy 10/02/2020  . Polyhydramnios affecting pregnancy 09/10/2020  . History of postpartum hemorrhage 08/06/2020  . Gestational diabetes 08/06/2020  . Group B streptococcal bacteriuria 07/31/2020  . Supervision of high risk pregnancy, antepartum 07/25/2020  . AMA (advanced maternal age) multigravida 35+ 07/25/2020  . Elevated blood pressure reading in office without diagnosis of hypertension 07/25/2020  . Obesity in pregnancy 07/25/2020  . Language barrier, cultural differences 11/30/2011  . Obese 11/25/2011    Lajean Manes, CNM 11/20/2020, 4:24 AM

## 2020-11-20 NOTE — MAU Note (Signed)
PT ARRIVED - AT REGISTRATION- IN W/C -  SAYS THE BABY'S HEAD IS COMING OUT.   TO  RM 130- F[facility]-116. VE - 9- JAMILLA, CNM , TO RM 216- VIA STRETCHER

## 2020-11-20 NOTE — Discharge Summary (Addendum)
Postpartum Discharge Summary   Patient Name: Elaine Kelly DOB: 02/01/1986 MRN: [id number]  Date of admission: 11/20/2020 Delivery date:11/20/2020  Delivering provider: Gaylan Gerold R  Date of discharge: 11/22/2020  Admitting diagnosis: Indication for care in labor or delivery [O75.9] Intrauterine pregnancy: [redacted]w[redacted]d    Secondary diagnosis:  Active Problems:   Language barrier, cultural differences   AMA (advanced maternal age) multigravida 35+   Obesity in pregnancy   Gestational diabetes   Polyhydramnios affecting pregnancy   Indication for care in labor or delivery   SVD (spontaneous vaginal delivery)   Precipitous delivery  Additional problems: none    Discharge diagnosis: Preterm Pregnancy Delivered and GDM A2                                              Post partum procedures:nexplanon Augmentation: N/A Complications: Placental Abruption  Hospital course: Onset of Labor With Vaginal Delivery      35y.o. yo [id number] 373w3das admitted in Active Labor on 11/20/2020. Patient had an uncomplicated labor course as follows:  Membrane Rupture Time/Date: 2:30 AM ,11/20/2020   Delivery Method:Vaginal, Spontaneous  Episiotomy: Median  Lacerations:  1st degree  Patient had an uncomplicated postpartum course.  She is ambulating, tolerating a regular diet, passing flatus, and urinating well. Patient is discharged home in stable condition on 11/22/20.  Newborn Data: Birth date:11/20/2020  Birth time:3:35 AM  Gender:Female  Living status:Living  Apgars:7 ,9  Weight:2444 g   Magnesium Sulfate received: No BMZ received: No Rhophylac:N/A MMR:N/A T-DaP:Given prenatally Flu: Given prenatally  Transfusion:No  Physical exam  Vitals:   11/21/20 0603 11/21/20 1623 11/21/20 2124 11/22/20 0522  BP: 106/68 124/76 122/68 117/74  Pulse: 87 88 79 79  Resp: '16 16 16 16  ' Temp: 98 F (36.7 C) 98.2 F (36.8 C) 97.6 F (36.4 C) 98.1 F (36.7 C)  TempSrc: Oral Oral Oral Oral   SpO2:  99% 99%   Weight:      Height:       General: alert, cooperative and no distress Lochia: appropriate Uterine Fundus: firm Incision: N/A DVT Evaluation: No evidence of DVT seen on physical exam. Labs: Lab Results  Component Value Date   WBC 13.5 (H) 11/20/2020   HGB 13.8 11/20/2020   HCT 42.8 11/20/2020   MCV 81.5 11/20/2020   PLT 207 11/20/2020   CMP Latest Ref Rng & Units 07/25/2020  Glucose 65 - 99 mg/dL 117(H)  BUN 6 - 20 mg/dL 6  Creatinine 0.57 - 1.00 mg/dL 0.47(L)  Sodium 134 - 144 mmol/L 138  Potassium 3.5 - 5.2 mmol/L 4.3  Chloride 96 - 106 mmol/L 103  CO2 20 - 29 mmol/L 21  Calcium 8.7 - 10.2 mg/dL 9.4  Total Protein 6.0 - 8.5 g/dL 6.3  Total Bilirubin 0.0 - 1.2 mg/dL <0.2  Alkaline Phos 44 - 121 IU/L 102  AST 0 - 40 IU/L 7  ALT 0 - 32 IU/L 7   Edinburgh Score: Edinburgh Postnatal Depression Scale Screening Tool 01/01/2019  I have been able to laugh and see the funny side of things. 0  I have looked forward with enjoyment to things. 0  I have blamed myself unnecessarily when things went wrong. 0  I have been anxious or worried for no good reason. 0  I have felt scared or panicky for no good  reason. 0  Things have been getting on top of me. 0  I have been so unhappy that I have had difficulty sleeping. 0  I have felt sad or miserable. 0  I have been so unhappy that I have been crying. 0  The thought of harming myself has occurred to me. 0  Edinburgh Postnatal Depression Scale Total 0     After visit meds:  Allergies as of 11/22/2020   No Known Allergies     Medication List    STOP taking these medications   aspirin EC 81 MG tablet   Butalbital-APAP-Caffeine 50-325-40 MG capsule     TAKE these medications   Accu-Chek Guide test strip Generic drug: glucose blood Use as instructed. Please check blood glucose 4 times daily.   Accu-Chek Softclix Lancets lancets Use as instructed. Please check blood glucose 4 times daily.   acetaminophen  325 MG tablet Commonly known as: Tylenol Take 2 tablets (650 mg total) by mouth every 4 (four) hours as needed (for pain scale < 4). What changed:   medication strength  how much to take  when to take this  reasons to take this   BD Pen Needle Micro U/F 32G X 6 MM Misc Generic drug: Insulin Pen Needle 1 Units by Does not apply route as directed.   Blood Pressure Kit Devi 1 Device by Does not apply route as needed.   Comfort Fit Maternity Supp Lg Misc 1 Units by Does not apply route daily.   ibuprofen 600 MG tablet Commonly known as: ADVIL Take 1 tablet (600 mg total) by mouth every 6 (six) hours.   insulin lispro 100 UNIT/ML KwikPen Commonly known as: HumaLOG KwikPen Inject 10 units at breakfast and lunch and 8 units at dinner.   Levemir FlexTouch 100 UNIT/ML FlexPen Generic drug: insulin detemir Inject 15 Units into the skin daily.   metFORMIN 500 MG tablet Commonly known as: GLUCOPHAGE Take 2 tablets (1,000 mg total) by mouth 2 (two) times daily with a meal.   PrePLUS 27-1 MG Tabs Take 1 tablet by mouth daily.            Discharge Care Instructions  (From admission, onward)         Start     Ordered   11/22/20 0000  Discharge wound care:       Comments: Wash right arm nexplanon insertion site with soap and water.   11/22/20 0754           Discharge home in stable condition Infant Feeding: Breast Infant Disposition:rooming in Discharge instruction: per After Visit Summary and Postpartum booklet. Activity: Advance as tolerated. Pelvic rest for 6 weeks.  Diet: routine diet Future Appointments: Future Appointments  Date Time Provider [place]  12/25/2020  8:50 AM [facility]  12/25/2020  9:15 AM Clarnce Flock, MD [facility]   Follow up Visit:  Follow-up Information    Clarnce Flock, MD. Go on 12/25/2020.   Specialty: Family Medicine Why: Please arrive at 8:50 for a laboratory appointment Contact  information: [place] [phone]                Please schedule this patient for a In person postpartum visit in 4-6 weeks with the following provider: MD. Additional Postpartum F/U:2 hour GTT  High risk pregnancy complicated by: GDM Delivery mode:  Vaginal, Spontaneous  Anticipated Birth Control:  PP Nexplanon placed   02/16/6062 Arrie Senate, MD  GME ATTESTATION:  I saw and evaluated the patient. I agree with the findings and the plan of care as documented in the resident's note.  Arrie Senate, MD OB Fellow, [place] [facility] 11/22/2020 8:24 AM

## 2020-11-21 LAB — GC/CHLAMYDIA PROBE AMP (~~LOC~~) NOT AT ARMC
Chlamydia: NEGATIVE
Comment: NEGATIVE
Comment: NORMAL
Neisseria Gonorrhea: NEGATIVE

## 2020-11-21 LAB — SURGICAL PATHOLOGY

## 2020-11-21 MED ORDER — ETONOGESTREL 68 MG ~~LOC~~ IMPL
68.0000 mg | DRUG_IMPLANT | Freq: Once | SUBCUTANEOUS | Status: AC
  Administered 2020-11-21: 68 mg via SUBCUTANEOUS
  Filled 2020-11-21: qty 1

## 2020-11-21 MED ORDER — LIDOCAINE HCL 1 % IJ SOLN
0.0000 mL | Freq: Once | INTRAMUSCULAR | Status: AC | PRN
  Administered 2020-11-21: 20 mL via INTRADERMAL
  Filled 2020-11-21: qty 20

## 2020-11-21 NOTE — Procedures (Signed)
GYNECOLOGY OFFICE PROCEDURE NOTE   Ms. Elaine Kelly is a 35 y.o. [id number] s/p SVD for Nexplanon insertion. No complaints.  BP 106/68 (BP Location: Left Arm)   Pulse 87   Temp 98 F (36.7 C) (Oral)   Resp 16   Ht 5\' 7"  (1.702 m)   Wt 110.7 kg   LMP 03/03/2020 (Within Days)   SpO2 100%   Breastfeeding Unknown   BMI 38.22 kg/m    No results found for this or any previous visit (from the past 24 hour(s)).    Nexplanon Insertion Procedure Patient identified, informed consent performed, consent signed.   Patient does understand that irregular bleeding is a very common side effect of this medication. She was advised to have backup contraception for one week after placement. Pregnancy test in clinic today was negative.  Appropriate time out taken.  Patient's left arm was prepped and draped in the usual sterile fashion. The ruler used to measure and mark insertion area.  Patient was prepped with alcohol swab and then injected with 2 ml of 1% lidocaine.  She was prepped with betadine, Nexplanon removed from packaging,  Device confirmed in needle, then inserted full length of needle and withdrawn per handbook instructions. Nexplanon was able to palpated in the patient's arm; patient palpated the insert herself. There was minimal blood loss.  Patient insertion site covered with guaze and a pressure bandage to reduce any bruising.  The patient tolerated the procedure well and was given post procedure instructions. Follow-up via My Chart video visit , unless having problems and need to be seen in the office.  Nexplanon Lot#: [id number]/2020 / Expiration Date: 01/31/2023  02/02/2023, CNM  11/21/2020 10:46 AM

## 2020-11-21 NOTE — Progress Notes (Signed)
Set patient up with double electric breast pump. Education provided on use of pump and how to clean parts.

## 2020-11-21 NOTE — Progress Notes (Signed)
Post Partum Day 1 Subjective: Elaine Kelly  is a 35 y.o. K3T4656 s/p SVD at [redacted]w[redacted]d.  She reports she is doing well. No acute events overnight. She denies any problems with ambulating, voiding or po intake. Denies nausea or vomiting.  Pain is well controlled on ibuprofen.  Lochia is light with small clots and improving.  Objective: Blood pressure 106/68, pulse 87, temperature 98 F (36.7 C), temperature source Oral, resp. rate 16, height 5\' 7"  (1.702 m), weight 110.7 kg, last menstrual period 03/03/2020, SpO2 100 %, unknown if currently breastfeeding.  Physical Exam:  General: alert, cooperative and no distress Lochia: appropriate Uterine Fundus: firm Incision: n/a DVT Evaluation: No evidence of DVT seen on physical exam. Negative Homan's sign. No cords or calf tenderness. No significant calf/ankle edema.  Recent Labs    11/20/20 0325  HGB 13.8  HCT 42.8    Assessment/Plan: Plan for discharge tomorrow and Contraception Nexplanon   LOS: 1 day   11/22/20, CNM 11/21/2020, 10:43 AM

## 2020-11-22 ENCOUNTER — Encounter: Payer: Medicaid Other | Admitting: Obstetrics & Gynecology

## 2020-11-22 MED ORDER — ACETAMINOPHEN 325 MG PO TABS: 650.0000 mg | ORAL_TABLET | ORAL | 0 refills | Status: DC | PRN

## 2020-11-22 MED ORDER — IBUPROFEN 600 MG PO TABS: 600.0000 mg | ORAL_TABLET | Freq: Four times a day (QID) | ORAL | 0 refills | Status: DC

## 2020-11-22 NOTE — Lactation Note (Addendum)
This note was copied from a baby's chart. Lactation Consultation Note  Patient Name: [name] [id number] Date: 11/22/2020   Age:37 hours, LPTI with -2% weight loss. LC attempted to see mom and infant, family was asleep when [facility] entered the room. Mom has not been seen by [facility] services until request entered, infant is at 46 hours of life. LC notice mom has mostly bottle feed tonight but intake is low for 46 hour  LPTI infant, formula  intake  varies from 5, 6, 10 mls for most  feedings. LC discussed with RN infant intake should be 20 to 30 mls for LPTI per feeding on Day 3. LC gave green sheet to RN to discussed with mom,  due LC shift ending soon and if mom is not awake within the  next 2 hours.  LC suggested to RN mom to latch infant with every feeding and then supplement infant with formula afterwards.  LC discussed with RN inform mom to use DEBP and pump every 3 hours for 15 minutes on initial setting due infant being LPTI and to help establish mom's supply.  Maternal Data    Feeding Feeding Type: Bottle Fed - Formula Nipple Type: Nfant Extra Slow Flow (gold)  LATCH Score                   Interventions    Lactation Tools Discussed/Used     Consult Status      Danelle Earthly 11/22/2020, 1:41 AM

## 2020-11-22 NOTE — Lactation Note (Signed)
This note was copied from a baby's chart. Lactation Consultation Note  Patient Name: Elaine Kelly [id number] Date: 11/22/2020 Reason for consult: Follow-up assessment Age:37 hours  Reported to [facility] by the [facility] RN baby is still having challenges with feeding.  As LC entered the room , mom trying to feed with the very slow flow nipple (dark purple ) and baby had take 5 ml. Mom mentioned she was also given a gold nipple and clear nipple. LC switched the baby to the gold nipple and only was able to get 1 ml down the baby. Baby noted to chomping on the nipple even feeding baby on her side.  LC called the [place] [facility], Speech and she came in and was working with baby with the Avent premie nipple.  Bethann Humble aware of the tongue mobility challenge and plans to assess.   LC encouraged mom to pump with the DEBP to enhance her milk let down,  And would enhance the baby sustaining a latch longer.   Maternal Data Does the patient have breastfeeding experience prior to this delivery?: Yes  Feeding Feeding Type: Breast Fed Nipple Type: Extra Slow Flow (was was feeding, baby not feeding well, and mom mentioned she was using the gold also, switched and LC attempted without sucess / few sips)  LATCH Score Latch: Grasps breast easily, tongue down, lips flanged, rhythmical sucking.  Audible Swallowing: A few with stimulation  Type of Nipple: Everted at rest and after stimulation  Comfort (Breast/Nipple): Soft / non-tender  Hold (Positioning): Assistance needed to correctly position infant at breast and maintain latch.  LATCH Score: 8  Interventions Interventions: Breast feeding basics reviewed;Assisted with latch;Skin to skin;Breast massage;Hand express;Breast compression;Adjust position;Position options;Support pillows  Lactation Tools Discussed/Used Tools: Pump Breast pump type: Double-Electric Breast Pump   Consult Status Consult Status: Follow-up Date: 11/23/20 Follow-up type:  In-patient    Elaine Kelly 11/22/2020, 3:31 PM

## 2020-11-22 NOTE — Discharge Instructions (Signed)

## 2020-11-23 ENCOUNTER — Ambulatory Visit: Payer: Self-pay

## 2020-11-23 ENCOUNTER — Ambulatory Visit: Payer: Medicaid Other

## 2020-11-23 NOTE — Lactation Note (Signed)
This note was copied from a baby's chart. Lactation Consultation Note  Patient Name: Elaine Kelly JOINO'M Date: 11/23/2020 Reason for consult: Follow-up assessment Age:37 hours   LC Follow Up Visit:  Attempted to visit with mother, however, she was in the bathroom.  Baby was off the unit receiving a swallow study.  RN updated; will plan to return later today.   Maternal Data    Feeding Feeding Type: Formula Nipple Type: Other (Even flo nipple (provided by SLP))  LATCH Score                   Interventions    Lactation Tools Discussed/Used     Consult Status Consult Status: Follow-up Date: 11/23/20 Follow-up type: In-patient    Azure Barrales R Depaul Arizpe 11/23/2020, 11:08 AM

## 2020-11-24 ENCOUNTER — Ambulatory Visit: Payer: Self-pay

## 2020-11-24 NOTE — Lactation Note (Signed)
This note was copied from a baby's chart. Lactation Consultation Note  Patient Name: Elaine Kelly TKPTW'S Date: 11/24/2020 Reason for consult: Follow-up assessment;Late-preterm 34-36.6wks;Infant < 6lbs;NICU baby Age:37 days Mom requests DEBP to use while home, reports with pump parts at home. Mom reports having WIC ([place] states will call [facility] Monday to obtain pump. Loaner pump [id number] given.  Mom reports only pumping "when I feel my milk is there". Reinforced pump q3hrs, frequency and consistency, milk collection, labeling and storage guidelines. Mom voiced understanding and with no further concerns. BGilliam, RN, IBCLC      Interventions Interventions: DEBP;Breast feeding basics reviewed  Lactation Tools Discussed/Used WIC Program: Yes   Consult Status Consult Status: Follow-up Date: 11/25/20 Follow-up type: In-patient    Charlynn Court 11/24/2020, 7:44 PM

## 2020-11-29 ENCOUNTER — Ambulatory Visit: Payer: Medicaid Other

## 2020-11-29 ENCOUNTER — Encounter: Payer: Medicaid Other | Admitting: Obstetrics & Gynecology

## 2020-12-07 ENCOUNTER — Encounter: Payer: Medicaid Other | Admitting: Obstetrics & Gynecology

## 2020-12-13 ENCOUNTER — Telehealth: Payer: Self-pay | Admitting: General Practice

## 2020-12-13 NOTE — Telephone Encounter (Signed)
Called patient with pacific interpreter [phone], no answer- left message stating her insulin is ready for pick up at her pharmacy, please go pick it up. She may call back if she has questions.

## 2020-12-14 ENCOUNTER — Encounter: Payer: Medicaid Other | Admitting: Family Medicine

## 2020-12-25 ENCOUNTER — Other Ambulatory Visit: Payer: Medicaid Other

## 2020-12-25 ENCOUNTER — Ambulatory Visit: Payer: Medicaid Other | Admitting: Family Medicine

## 2020-12-31 ENCOUNTER — Other Ambulatory Visit: Payer: Self-pay | Admitting: *Deleted

## 2020-12-31 DIAGNOSIS — Z8632 Personal history of gestational diabetes: Secondary | ICD-10-CM

## 2021-01-02 ENCOUNTER — Encounter: Payer: Self-pay | Admitting: Family Medicine

## 2021-01-02 ENCOUNTER — Other Ambulatory Visit: Payer: Medicaid Other

## 2021-01-02 ENCOUNTER — Ambulatory Visit (INDEPENDENT_AMBULATORY_CARE_PROVIDER_SITE_OTHER): Payer: Medicaid Other | Admitting: Family Medicine

## 2021-01-02 DIAGNOSIS — Z8632 Personal history of gestational diabetes: Secondary | ICD-10-CM

## 2021-01-02 DIAGNOSIS — O24414 Gestational diabetes mellitus in pregnancy, insulin controlled: Secondary | ICD-10-CM

## 2021-01-02 DIAGNOSIS — Z603 Acculturation difficulty: Secondary | ICD-10-CM

## 2021-01-02 DIAGNOSIS — O0990 Supervision of high risk pregnancy, unspecified, unspecified trimester: Secondary | ICD-10-CM

## 2021-01-02 DIAGNOSIS — Z975 Presence of (intrauterine) contraceptive device: Secondary | ICD-10-CM

## 2021-01-02 MED ORDER — NORGESTIMATE-ETH ESTRADIOL 0.25-35 MG-MCG PO TABS: 1.0000 | ORAL_TABLET | Freq: Every day | ORAL | 2 refills | Status: DC

## 2021-01-02 MED ORDER — PREPLUS 27-1 MG PO TABS: 1.0000 | ORAL_TABLET | Freq: Every day | ORAL | 13 refills | Status: DC

## 2021-01-02 NOTE — Patient Instructions (Signed)

## 2021-01-02 NOTE — Progress Notes (Signed)
Post Partum Visit Note  Elaine Kelly is a 35 y.o. [phone] female who presents for a postpartum visit. She is 6 weeks postpartum following a normal spontaneous vaginal delivery.  I have fully reviewed the prenatal and intrapartum course. The delivery was at 36/3 gestational weeks.  Anesthesia: none. Postpartum course has been difficult. Baby is currently still in the NICU. Baby is feeding by breast. Bleeding moderate lochia. Bowel function is normal. Bladder function is normal. Patient is not sexually active. Contraception method is Nexplanon. Postpartum depression screening: negative.   The pregnancy intention screening data noted above was reviewed. Elaine Kelly Postnatal Depression Scale - 01/02/21 0904      Edinburgh Postnatal Depression Scale:  In the Past 7 Days   I have been able to laugh and see the funny side of things. 1    I have looked forward with enjoyment to things. 1    I have blamed myself unnecessarily when things went wrong. 0    I have been anxious or worried for no good reason. 0    I have felt scared or panicky for no good reason. 0    Things have been getting on top of me. 0    I have been so unhappy that I have had difficulty sleeping. 0    I have felt sad or miserable. 0    I have been so unhappy that I have been crying. 1    The thought of harming myself has occurred to me. 0    Edinburgh Postnatal Depression Scale Total 3            The following portions of the patient's history were reviewed and updated as appropriate: allergies, current medications, past family history, past medical history, past social history, past surgical history and problem list.  Review of Systems Pertinent items noted in HPI and remainder of comprehensive ROS otherwise negative.    Objective:  BP 114/80   Pulse 88   Ht 5\' 6"  (1.676 m)   Wt 245 lb 4.8 oz (111.3 kg)   LMP 03/03/2020 (Within Days)   BMI 39.59 kg/m    General:  alert, cooperative and appears stated  age  Lungs: comfortable on room air        Assessment:    Normal postpartum exam. Pap smear not done at today's visit.   Plan:   Essential components of care per ACOG recommendations:  1.  Mood and well being: Patient with negative depression screening today. Reviewed local resources for support.  - Patient does not use tobacco.  - hx of drug use? No    2. Infant care and feeding:  -Patient currently breastmilk feeding? Yes  Reviewed importance of draining breast regularly to support lactation. -Social determinants of health (SDOH) reviewed in EPIC. No concerns  3. Sexuality, contraception and birth spacing - Patient does not want a pregnancy in the next year.    - Patient has Nexplanon in place and irregular ongoing bleeding, given pack of OCP's to stop bleeding during Ramadan   - Discussed birth spacing of 18 months  4. Sleep and fatigue -Encouraged family/partner/community support of 4 hrs of uninterrupted sleep to help with mood and fatigue  5. Physical Recovery  - Discussed patients delivery and complications - Patient had a 1st degree laceration, perineal healing reviewed. Patient expressed understanding - Patient has urinary incontinence? No - Patient is safe to resume physical and sexual activity  6.  Health Maintenance -  Last pap smear done 08/06/2020 and was normal with negative HPV. - Mammogram: n/a  7. Chronic Disease - PCP follow up  Venora Maples, MD Center for [facility], [facility]

## 2021-01-03 ENCOUNTER — Telehealth: Payer: Self-pay | Admitting: Family Medicine

## 2021-01-03 DIAGNOSIS — O0990 Supervision of high risk pregnancy, unspecified, unspecified trimester: Secondary | ICD-10-CM

## 2021-01-03 DIAGNOSIS — O24415 Gestational diabetes mellitus in pregnancy, controlled by oral hypoglycemic drugs: Secondary | ICD-10-CM

## 2021-01-03 LAB — GLUCOSE TOLERANCE, 2 HOURS
Glucose, 2 hour: 196 mg/dL — ABNORMAL HIGH (ref 65–139)
Glucose, GTT - Fasting: 106 mg/dL — ABNORMAL HIGH (ref 65–99)

## 2021-01-03 MED ORDER — METFORMIN HCL 1000 MG PO TABS: 1000.0000 mg | ORAL_TABLET | Freq: Two times a day (BID) | ORAL | 3 refills | Status: DC

## 2021-01-03 NOTE — Telephone Encounter (Signed)
Pt called @ 1458 and left a message stating that she is returning a call regarding results. Please call back.

## 2021-01-03 NOTE — Telephone Encounter (Signed)
Called patient with assistance of [employer] [place], [place], [place] [phone].   Patient did not answer. LM for her to call the office with results of 2 hour GTT and recommendation for follow up with PCP.

## 2021-01-03 NOTE — Telephone Encounter (Signed)
Please call patient and let her know that her post partum 2hr GTT came back abnormal in the range of pre-diabetes and very very close to meeting criteria for full diabetes. She needs to establish care with a PCP within the next two months. I also recommend that she continue the metformin indefinitely, and I have sent a refill to her pharmacy. Please refer her to [facility] clinic or [facility] if she needs suggestions on where to go.

## 2021-01-04 ENCOUNTER — Encounter: Payer: Self-pay | Admitting: *Deleted

## 2021-01-07 NOTE — Telephone Encounter (Addendum)
Called patient with assistance of [employer]pre[employer], # [phone]  with results of 2 hour gtt. Patient did not answer. Will send letter to patient as have been unable to reach. Will send information on local PCP's in the area.

## 2021-09-28 ENCOUNTER — Other Ambulatory Visit: Payer: Self-pay

## 2021-09-28 ENCOUNTER — Ambulatory Visit (HOSPITAL_COMMUNITY)
Admission: EM | Admit: 2021-09-28 | Discharge: 2021-09-28 | Disposition: A | Discharge status: Home / Self Care | Payer: Medicaid Other | Attending: Internal Medicine | Admitting: Internal Medicine

## 2021-09-28 ENCOUNTER — Encounter (HOSPITAL_COMMUNITY): Payer: Self-pay | Admitting: Emergency Medicine

## 2021-09-28 DIAGNOSIS — L03012 Cellulitis of left finger: Secondary | ICD-10-CM | POA: Diagnosis not present

## 2021-09-28 MED ORDER — IBUPROFEN 600 MG PO TABS: 600.0000 mg | ORAL_TABLET | Freq: Four times a day (QID) | ORAL | 0 refills | Status: DC | PRN

## 2021-09-28 MED ORDER — DOXYCYCLINE HYCLATE 100 MG PO CAPS: 100.0000 mg | ORAL_CAPSULE | Freq: Two times a day (BID) | ORAL | 0 refills | Status: AC

## 2021-09-28 NOTE — ED Triage Notes (Addendum)
Pt presents with ongoing finger pain. States was given antibiotic but no relief.

## 2021-09-28 NOTE — ED Provider Notes (Signed)
[facility]    CSN: [id number] Arrival date & time: 09/28/21  1256      History   Chief Complaint Chief Complaint  Patient presents with   Finger Injury    HPI Elaine Kelly is a 35 y.o. female comes to the urgent care with right index finger pain which started 3 days ago.  Symptoms have been intermittent over the past several months.  Pain is severe, throbbing, constant with no relieving factors.  Mild erythema on the left index finger.  No swelling of the finger.  Mild erythema on the medial aspect of the right index finger tip.  No trauma to the finger.  Patient cooks a lot and she has from using water for all the time.  No fever or chills.  HPI  Past Medical History:  Diagnosis Date   Diabetes mellitus    Gestational diabetes    Postpartum hemorrhage    Thrombocytopenia (HCC)     Patient Active Problem List   Diagnosis Date Noted   Nexplanon in place 01/02/2021   Indication for care in labor or delivery 11/20/2020   SVD (spontaneous vaginal delivery) 11/20/2020   Precipitous delivery 11/20/2020   [redacted] weeks gestation of pregnancy 10/02/2020   Polyhydramnios affecting pregnancy 09/10/2020   History of postpartum hemorrhage 08/06/2020   Gestational diabetes 08/06/2020   Group B streptococcal bacteriuria 07/31/2020   Supervision of high risk pregnancy, antepartum 07/25/2020   AMA (advanced maternal age) multigravida 35+ 07/25/2020   Elevated blood pressure reading in office without diagnosis of hypertension 07/25/2020   Obesity in pregnancy 07/25/2020   Language barrier, cultural differences 11/30/2011   Obese 11/25/2011    Past Surgical History:  Procedure Laterality Date   NO PAST SURGERIES      OB History     Gravida  7   Para  6   Term  5   Preterm  1   AB  1   Living  6      SAB  1   IAB  0   Ectopic  0   Multiple  0   Live Births  6            Home Medications    Prior to Admission medications   Medication Sig  Start Date End Date Taking? Authorizing Provider  doxycycline (VIBRAMYCIN) 100 MG capsule Take 1 capsule (100 mg total) by mouth 2 (two) times daily for 5 days. 09/28/21 10/03/21 Yes Luvern Mcisaac, Britta Mccreedy, MD  ibuprofen (ADVIL) 600 MG tablet Take 1 tablet (600 mg total) by mouth every 6 (six) hours as needed. 09/28/21  Yes Loredana Medellin, Britta Mccreedy, MD  acetaminophen (TYLENOL) 325 MG tablet Take 2 tablets (650 mg total) by mouth every 4 (four) hours as needed (for pain scale < 4). 11/22/20   Shirlean Mylar, MD  metFORMIN (GLUCOPHAGE) 1000 MG tablet Take 1 tablet (1,000 mg total) by mouth 2 (two) times daily with a meal. 01/03/21 12/29/21  Venora Maples, MD  norgestimate-ethinyl estradiol (ORTHO-CYCLEN) 0.25-35 MG-MCG tablet Take 1 tablet by mouth daily. 01/02/21   Venora Maples, MD  Prenatal Vit-Fe Fumarate-FA (PREPLUS) 27-1 MG TABS Take 1 tablet by mouth daily. 01/02/21   Venora Maples, MD    Family History Family History  Problem Relation Age of Onset   Hypertension Father    Cancer Maternal Aunt        leaukemia   Diabetes Paternal Grandmother    Diabetes Mother    Hypertension Mother  Hyperlipidemia Mother     Social History Social History   Tobacco Use   Smoking status: Never   Smokeless tobacco: Never  Vaping Use   Vaping Use: Never used  Substance Use Topics   Alcohol use: No   Drug use: No     Allergies   Patient has no known allergies.   Review of Systems Review of Systems  Gastrointestinal: Negative.   Musculoskeletal: Negative.  Negative for joint swelling and myalgias.  Skin:  Positive for color change. Negative for pallor, rash and wound.  Neurological: Negative.     Physical Exam Triage Vital Signs ED Triage Vitals  Enc Vitals Group     BP 09/28/21 1433 133/83     Pulse Rate 09/28/21 1433 69     Resp 09/28/21 1433 16     Temp 09/28/21 1433 98.7 F (37.1 C)     Temp Source 09/28/21 1433 Oral     SpO2 09/28/21 1433 100 %     Weight --      Height  --      Head Circumference --      Peak Flow --      Pain Score 09/28/21 1431 6     Pain Loc --      Pain Edu? --      Excl. in [place]? --    No data found.  Updated Vital Signs BP 133/83 (BP Location: Left Arm)   Pulse 69   Temp 98.7 F (37.1 C) (Oral)   Resp 16   LMP 08/30/2021   SpO2 100%   Visual Acuity Right Eye Distance:   Left Eye Distance:   Bilateral Distance:    Right Eye Near:   Left Eye Near:    Bilateral Near:     Physical Exam Vitals and nursing note reviewed.  Constitutional:      General: She is not in acute distress.    Appearance: She is not ill-appearing.  Cardiovascular:     Rate and Rhythm: Normal rate and regular rhythm.  Pulmonary:     Effort: Pulmonary effort is normal.     Breath sounds: Normal breath sounds.  Musculoskeletal:     Comments: Tenderness to palpation on the medial aspect of the left index finger.  Mild erythema.  No purulent discharge.  No ingrowing nail.  Neurological:     Mental Status: She is alert.     UC Treatments / Results  Labs (all labs ordered are listed, but only abnormal results are displayed) Labs Reviewed - No data to display  EKG   Radiology No results found.  Procedures Procedures (including critical care time)  Medications Ordered in UC Medications - No data to display  Initial Impression / Assessment and Plan / UC Course  I have reviewed the triage vital signs and the nursing notes.  Pertinent labs & imaging results that were available during my care of the patient were reviewed by me and considered in my medical decision making (see chart for details).     1.  Paronychia involving the left index finger: Doxycycline 100 mg twice daily for 7 days Warm salt water soaks Ibuprofen as needed for pain Return to urgent care if symptoms worsen. Final Clinical Impressions(s) / UC Diagnoses   Final diagnoses:  Paronychia of left index finger     Discharge Instructions      Please take  medications as prescribed Warm salt water soak finger in warm salt water 2-3 times a day If  symptoms worsen please return to urgent care to be reevaluated.   ED Prescriptions     Medication Sig Dispense Auth. Provider   doxycycline (VIBRAMYCIN) 100 MG capsule Take 1 capsule (100 mg total) by mouth 2 (two) times daily for 5 days. 20 capsule Markey Deady, Britta Mccreedy, MD   ibuprofen (ADVIL) 600 MG tablet Take 1 tablet (600 mg total) by mouth every 6 (six) hours as needed. 30 tablet Ramesh Moan, Britta Mccreedy, MD      PDMP not reviewed this encounter.   Merrilee Jansky, MD 09/28/21 [phone]

## 2021-09-28 NOTE — Discharge Instructions (Addendum)
Please take medications as prescribed Warm salt water soak finger in warm salt water 2-3 times a day If symptoms worsen please return to urgent care to be reevaluated.

## 2022-01-02 ENCOUNTER — Encounter: Payer: Self-pay | Admitting: Physician Assistant

## 2022-08-19 IMAGING — US US MFM OB FOLLOW-UP
1 series · 12 of 28 positions shown · non-contrast
Comparison: none

[Series 1: us mfm ob follow-up · 12 of 74 slices shown]
[im 3/74]
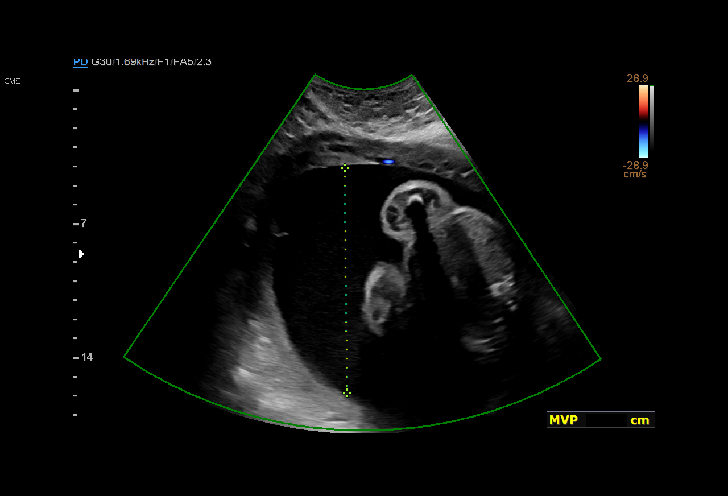
[im 9/74]
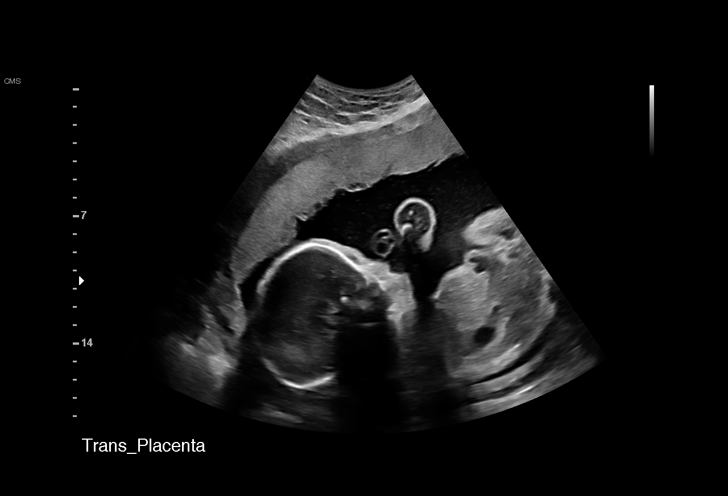
[im 14/74]
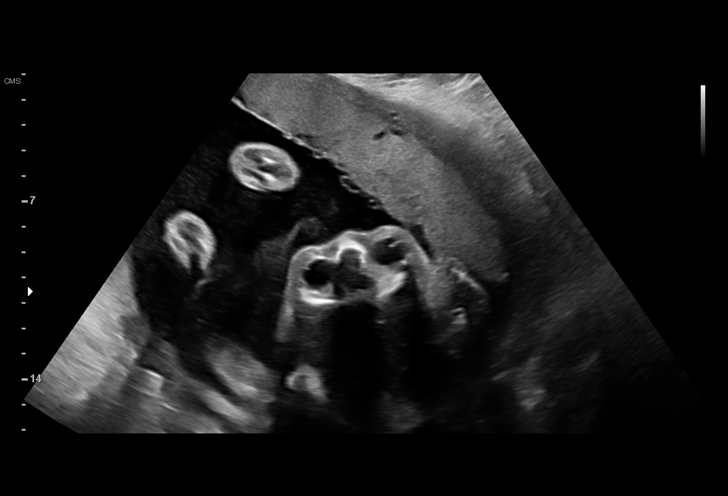
[im 22/74]
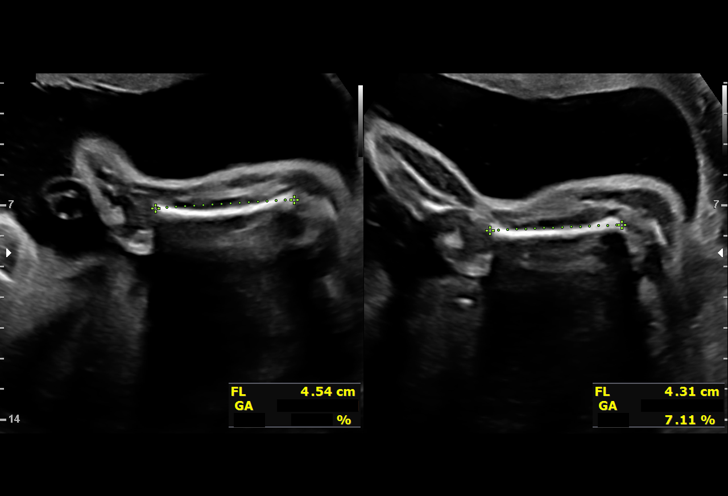
[im 28/74]
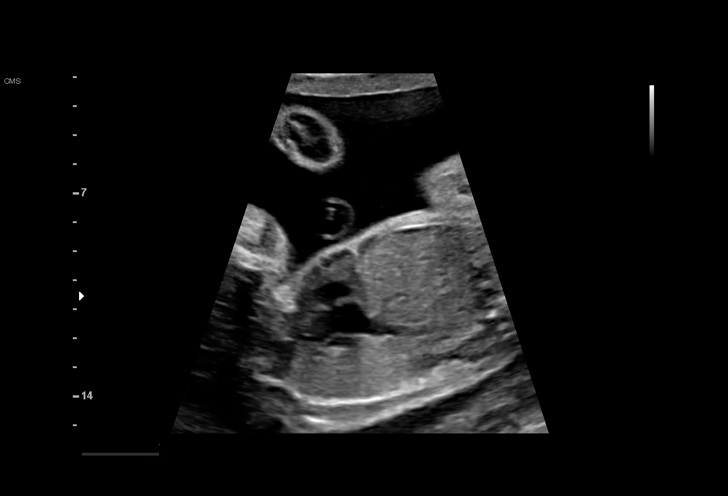
[im 33/74]
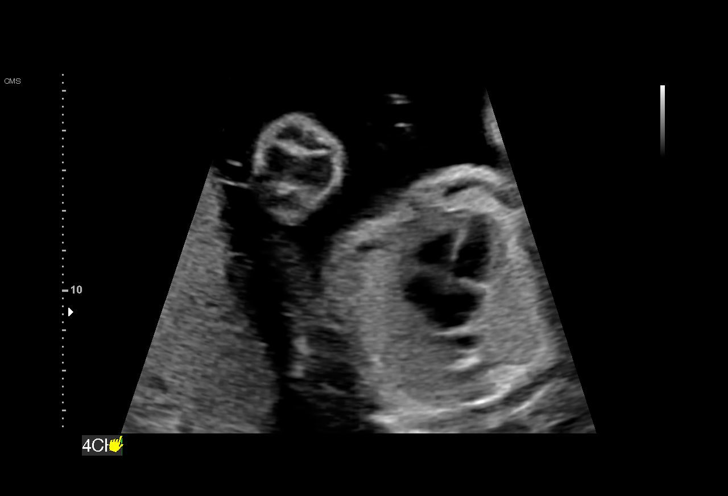
[im 41/74]
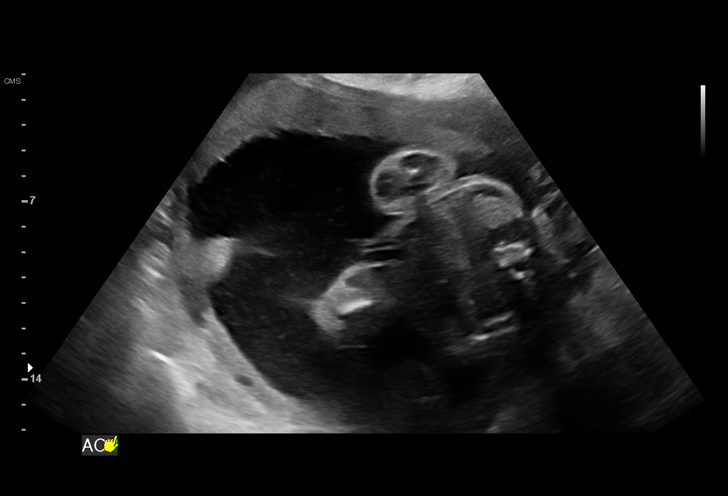
[im 46/74]
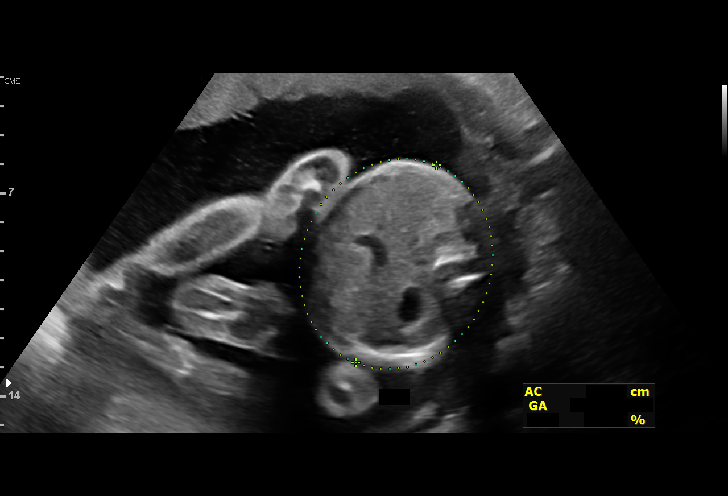
[im 52/74]
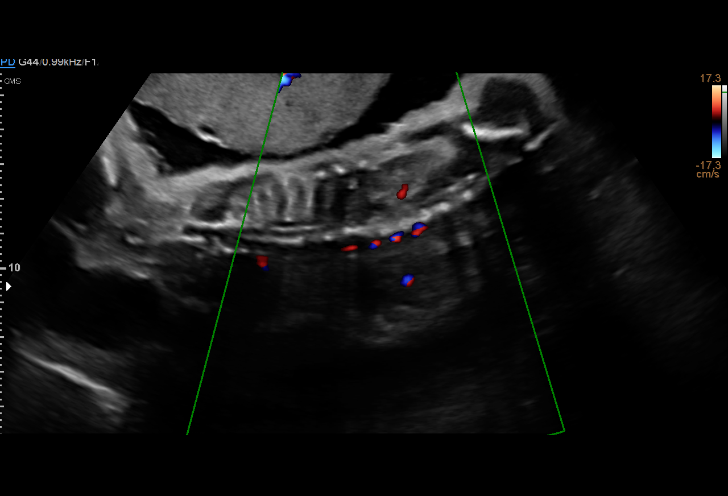
[im 60/74]
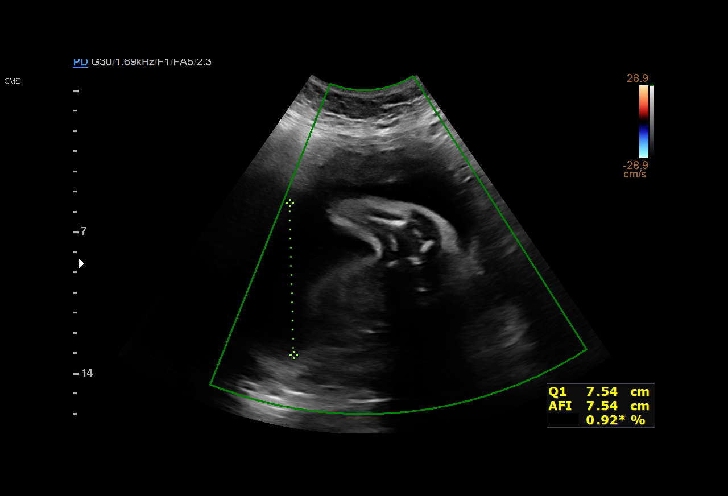
[im 65/74]
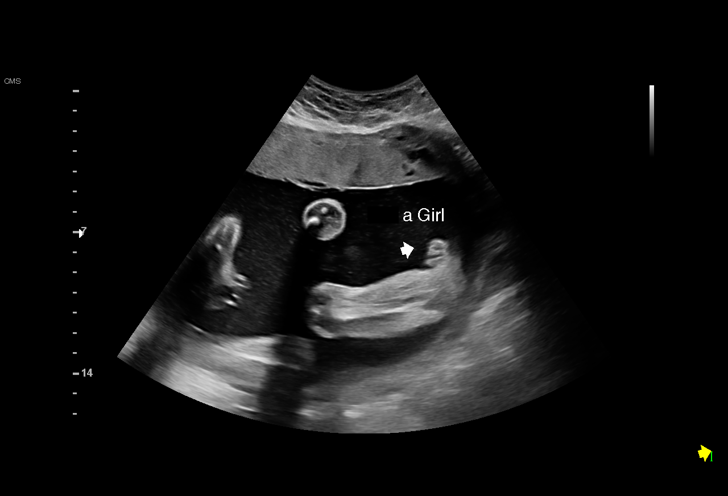
[im 71/74]
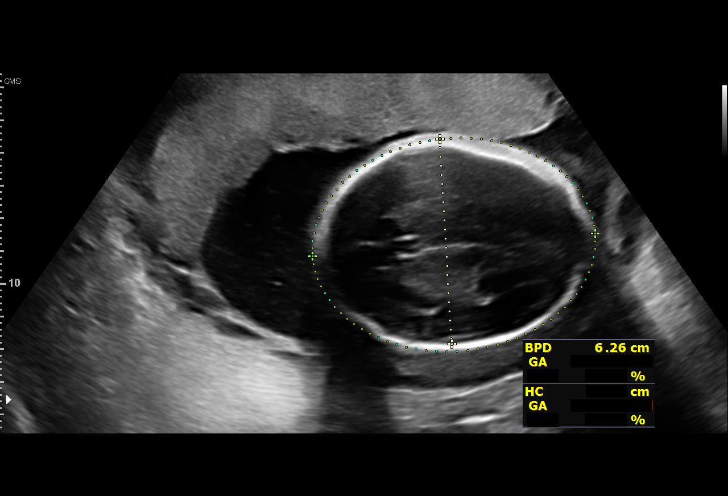

[12 of 28 positions shown; findings below may reference images not displayed]

Indications

 Encounter for other antenatal screening
 follow-up
 Advanced maternal age multigravida 35+,
 second trimester
 Gestational diabetes in pregnancy,
 unspecified control (versus DM)
 Maternal morbid obesity (BMI 41)
 Neg AFP, LR NIPS, neg Horizon
 Grand multiparity, antepartum
 Poor obstetric history: Previous gestational
 diabetes
 25 weeks gestation of pregnancy
Vital Signs

                                                Height:        5'5"
Fetal Evaluation

 Num Of Fetuses:         1
 Fetal Heart Rate(bpm):  162
 Cardiac Activity:       Observed
 Presentation:           Transverse, head to maternal right
 Placenta:               Anterior
 P. Cord Insertion:      Visualized

 Amniotic Fluid
 AFI FV:      Polyhydramnios

 AFI Sum(cm)     %Tile       Largest Pocket(cm)
 30.3            > 97
 RUQ(cm)       RLQ(cm)       LUQ(cm)        LLQ(cm)
 7.5           6.6           8.2            8
Biometry

 BPD:        62  mm     G. Age:  25w 1d         32  %    CI:        67.95   %    70 - 86
                                                         FL/HC:      18.2   %    18.7 -
 HC:      240.6  mm     G. Age:  26w 1d         52  %    HC/AC:      1.11        1.04 -
 AC:      216.1  mm     G. Age:  26w 1d         62  %    FL/BPD:     70.6   %    71 - 87
 FL:       43.8  mm     G. Age:  24w 3d         11  %    FL/AC:      20.3   %    20 - 24
 LV:        1.7  mm

 Est. FW:     810  gm    1 lb 13 oz      40  %
OB History

 Gravidity:    7         Term:   5         SAB:   1
 Living:       5
Gestational Age

 LMP:           26w 3d        Date:  03/03/20                 EDD:   12/08/20
 U/S Today:     25w 3d                                        EDD:   12/15/20
 Best:          25w 3d     Det. By:  U/S  (08/07/20)          EDD:   12/15/20
Anatomy

 Cranium:               Appears normal         LVOT:                   Appears normal
 Cavum:                 Appears normal         Aortic Arch:            Appears normal
 Ventricles:            Appears normal         Ductal Arch:            Appears normal
 Choroid Plexus:        Previously seen        Diaphragm:              Appears normal
 Cerebellum:            Previously seen        Stomach:                Appears normal, left
                                                                       sided
 Posterior Fossa:       Previously seen        Abdomen:                Persistent right
                                                                       umbilical vein
 Nuchal Fold:           Not applicable (>20    Abdominal Wall:         Appears nml (cord
                        wks GA)                                        insert, abd wall)
 Face:                  Appears normal         Cord Vessels:           Appears normal (3
                        (orbits and profile)                           vessel cord)
 Lips:                  Appears normal         Kidneys:                Appear normal
 Palate:                Not well visualized    Bladder:                Appears normal
 Thoracic:              Appears normal         Spine:                  Previously seen
 Heart:                 Appears normal         Upper Extremities:      Previously seen
                        (4CH, axis, and
                        situs)
 RVOT:                  Appears normal         Lower Extremities:      Previously seen

 Other:  Fetus appears to be female. Heels and 5th digits visualized. Open
         hands previously visualized. Technically difficult due to maternal
         habitus and fetal position.
Cervix Uterus Adnexa

 Cervix
 Not visualized (advanced GA >99wks)
 Uterus
 No abnormality visualized.

 Right Ovary
 Within normal limits.

 Left Ovary
 Within normal limits.

 Cul De Sac
 No free fluid seen.

 Adnexa
 No abnormality visualized.
Comments

 This patient was seen for a follow up growth scan due to
 advanced maternal age, maternal obesity, and pregestational
 diabetes that is not currently treated with any medications.
 The patient reports that her fasting fingerstick values are
 usually around the 120s range and her 2-hour postprandials
 have been as high as 170s.  The patient had a normal fetal
 echocardiogram performed with [facility].
 She was informed that the fetal growth appears appropriate
 for her gestational age.  Polyhydramnios with a total AFI of 30
 cm is noted today.
 The patient was advised that the polyhydramnios noted today
 is most likely related to her elevated blood glucose levels.
 She was advised that she will most likely require treatment
 with either Metformin or insulin to lower her blood sugar
 levels.  Hopefully, once her blood sugars are under better
 control, the polyhydramnios will resolve.
 The increased risk of an adverse pregnancy outcome such as
 an IUFD and continued polyhydramnios causing preterm
 labor and PPROM due to uncontrolled blood sugars was
 discussed with the patient today.
 She was advised to schedule an appointment as soon as
 possible so that she may be started on the appropriate
 treatment for gestational diabetes.
 A follow-up growth scan was scheduled in 4 weeks.

## 2022-10-16 IMAGING — US US MFM FETAL BPP W/O NON-STRESS
1 series · 14 of 28 positions shown · non-contrast
Comparison: none

[Series 1: us mfm fetal bpp w/o non-stress · 52 acquisitions, 14 frames shown]
[im 2/52]
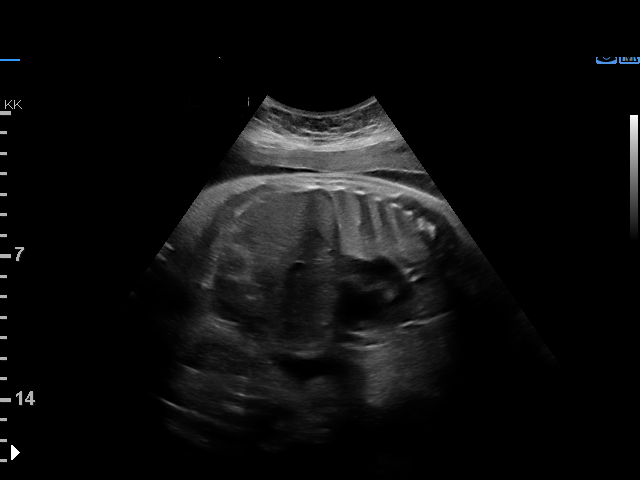
[im 6/52]
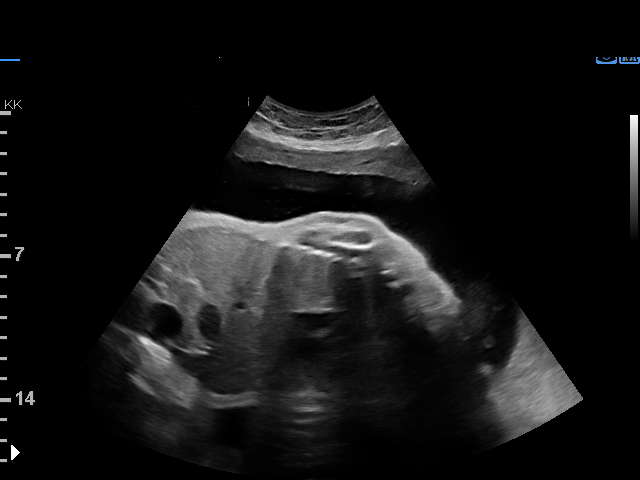
[im 10/52]
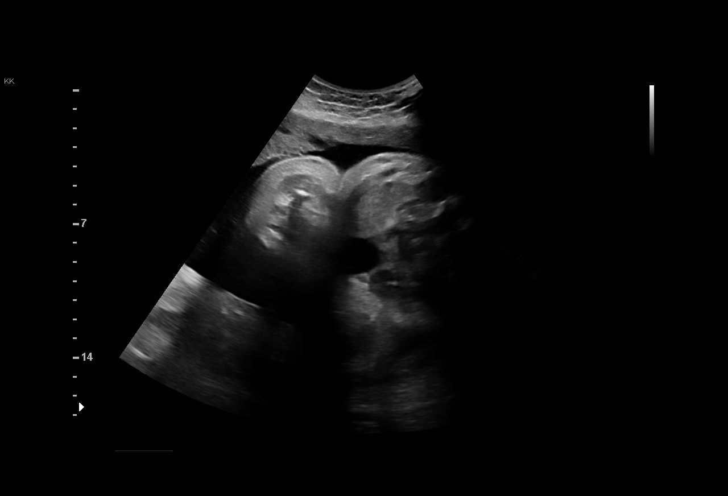
[im 14/52]
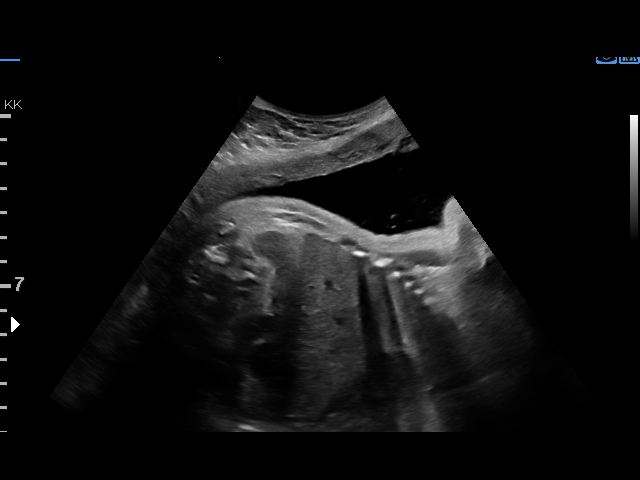
[im 18/52]
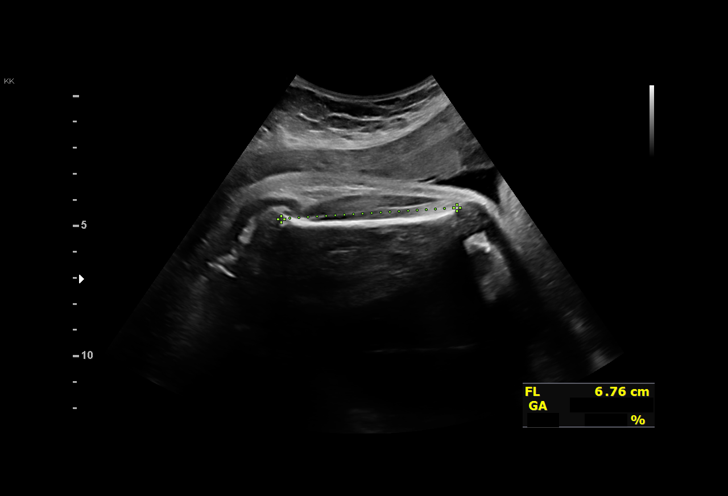
[im 21/52]
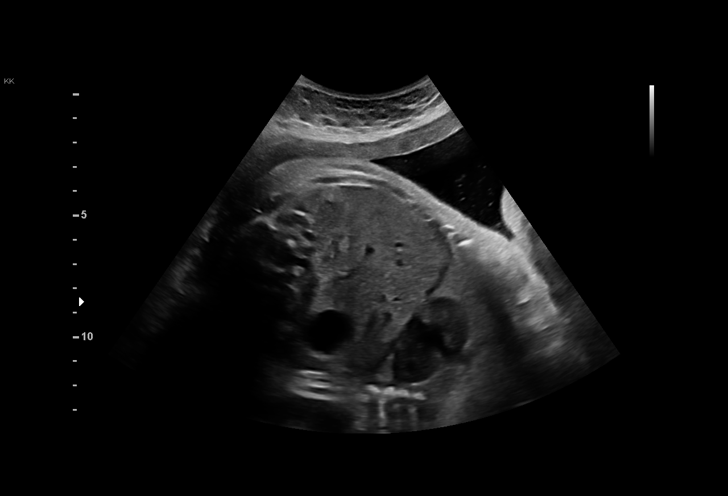
[im 25/52]
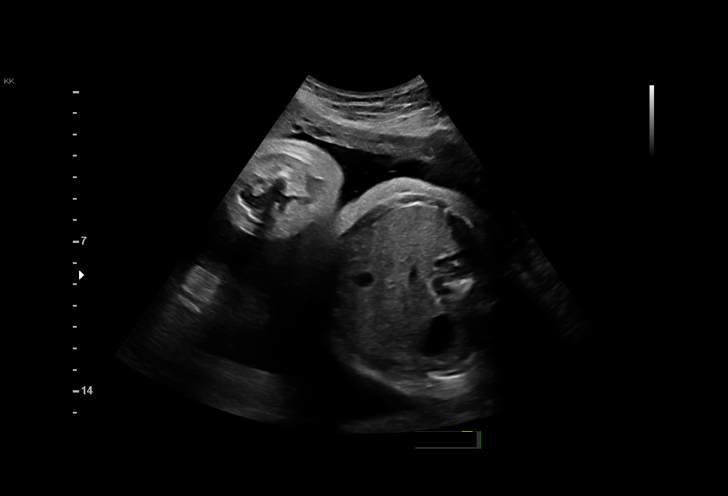
[im 29/52]
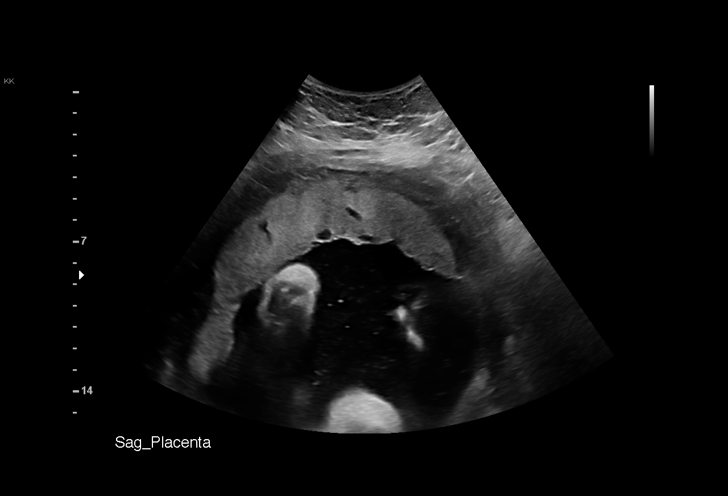
[im 33/52]
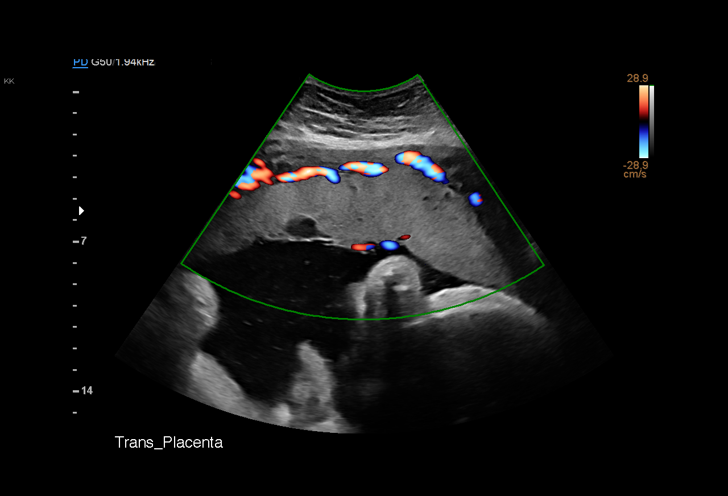
[im 36/52]
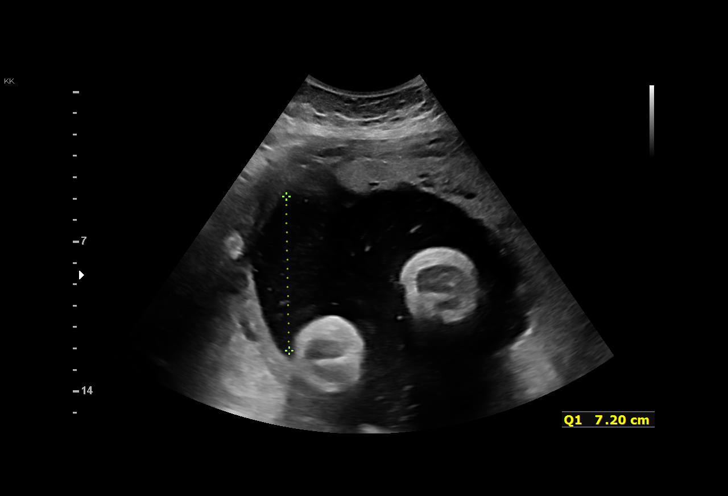
[im 40/52]
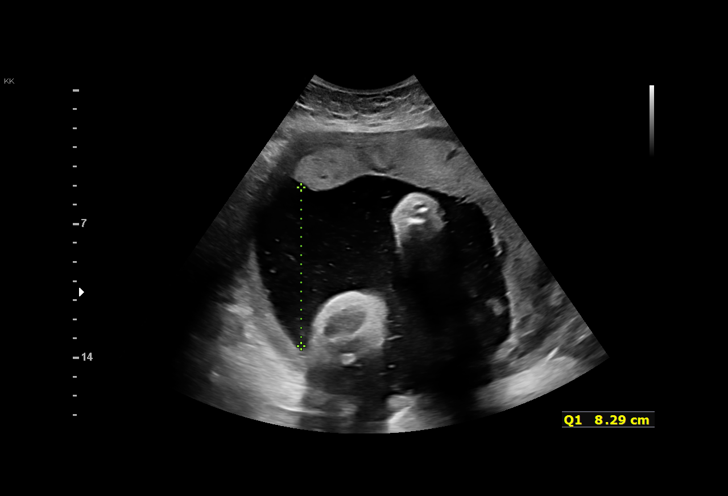
[im 44/52]
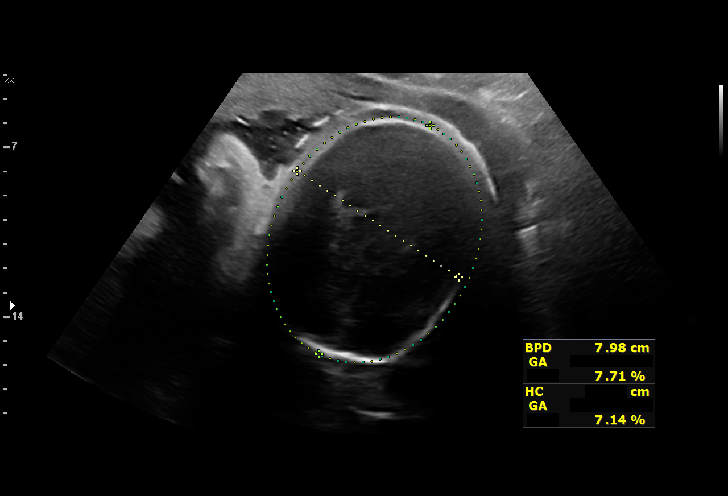
[im 48/52]
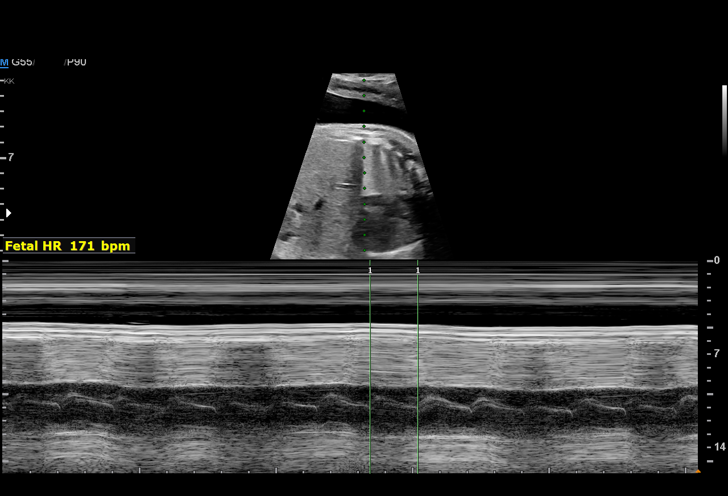
[im 52/52]
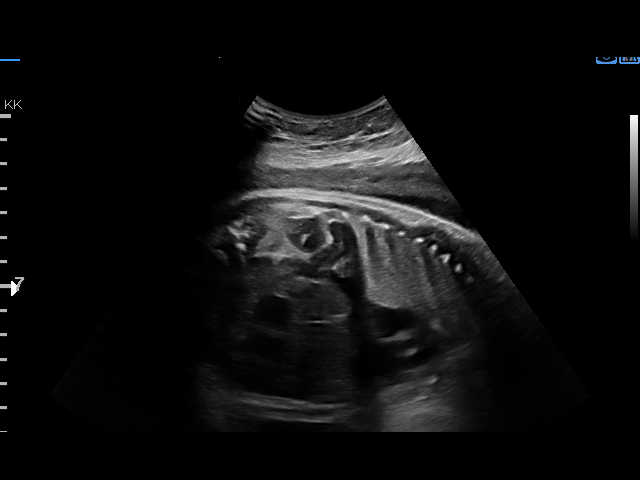

[14 of 28 positions shown; findings below may reference images not displayed]

Indications

 Gestational diabetes in pregnancy, insulin
 controlled
 Advanced maternal age multigravida 35+,
 second trimester
 Polyhydramnios, third trimester, antepartum
 condition or complication, unspecified fetus
 Maternal morbid obesity (BMI 41)
 Neg AFP, LR NIPS, neg Horizon
 Grand multiparity, antepartum
 Poor obstetric history: Previous gestational
 diabetes
 Encounter for other antenatal screening
 follow-up
 33 weeks gestation of pregnancy
Vital Signs

 BMI:
Fetal Evaluation

 Num Of Fetuses:          1
 Fetal Heart Rate(bpm):   157
 Cardiac Activity:        Observed
 Presentation:            Cephalic
 Placenta:                Anterior
 P. Cord Insertion:       Previously Visualized
 Amniotic Fluid
 AFI FV:      Polyhydramnios

 AFI Sum(cm)     %Tile       Largest Pocket(cm)
 25.13           95

 RUQ(cm)       RLQ(cm)       LUQ(cm)        LLQ(cm)

Biophysical Evaluation

 Amniotic F.V:   Polyhydramnios             F. Tone:         Observed
 F. Movement:    Observed                   Score:           [DATE]
 F. Breathing:   Observed
Biometry

 BPD:      82.9  mm     G. Age:  33w 2d         35  %    CI:        77.82   %    70 - 86
                                                         FL/HC:       22.2  %    19.4 -
 HC:      297.4  mm     G. Age:  32w 6d          5  %    HC/AC:       0.98       0.96 -
 AC:        304  mm     G. Age:  34w 3d         72  %    FL/BPD:      79.7  %    71 - 87
 FL:       66.1  mm     G. Age:  34w 0d         49  %    FL/AC:       21.7  %    20 - 24

 Est. FW:    3033   gm     5 lb 2 oz     52  %
OB History

 Gravidity:    7         Term:   5         SAB:   1
 Living:       5
Gestational Age

 LMP:           34w 5d        Date:  03/03/20                 EDD:   12/08/20
 U/S Today:     33w 5d                                        EDD:   12/15/20
 Best:          33w 5d     Det. By:  U/S  (08/07/20)          EDD:   12/15/20
Anatomy

 Cranium:               Appears normal         Aortic Arch:            Previously seen
 Cavum:                 Appears normal         Ductal Arch:            Previously seen
 Ventricles:            Appears normal         Diaphragm:              Appears normal
 Choroid Plexus:        Previously seen        Stomach:                Appears normal, left
                                                                       sided
 Cerebellum:            Previously seen        Abdomen:                Persistent rt Vinicio
                                                                       vein prev seen
 Posterior Fossa:       Previously seen        Abdominal Wall:         Previously seen
 Nuchal Fold:           Not applicable (>20    Cord Vessels:           Previously seen
                        wks GA)
 Face:                  Orbits and profile     Kidneys:                Appear normal
                        previously seen
 Lips:                  Previously seen        Bladder:                Appears normal
 Thoracic:              Appears normal         Spine:                  Previously seen
 Heart:                 Previously seen        Upper Extremities:      Previously seen
 RVOT:                  Previously seen        Lower Extremities:      Previously seen
 LVOT:                  Previously seen

 Other:  Fetus appears to be female. Heels, 5th digits, Open hands previously
         visualized. Technically difficult due to maternal habitus and fetal
         position.
Cervix Uterus Adnexa

 Cervix
 Not visualized (advanced GA >24wks)
Impression

 Follow up growth due to [id number], elevated BMI and known
 polyhydramnios
 Normal interval growth with measurements consistent with
 dates
 Good fetal movement and polyhydramnios remains persistent.
 Biophysical profile [DATE]
Recommendations

 Follow up growth in 4 weeks.
 Continue weekly testing.

## 2022-12-19 ENCOUNTER — Ambulatory Visit (INDEPENDENT_AMBULATORY_CARE_PROVIDER_SITE_OTHER): Payer: Medicaid Other

## 2022-12-19 ENCOUNTER — Encounter (HOSPITAL_COMMUNITY): Payer: Self-pay

## 2022-12-19 ENCOUNTER — Ambulatory Visit (HOSPITAL_COMMUNITY)
Admission: EM | Admit: 2022-12-19 | Discharge: 2022-12-19 | Disposition: A | Discharge status: Home / Self Care | Payer: Medicaid Other | Attending: Internal Medicine | Admitting: Internal Medicine

## 2022-12-19 DIAGNOSIS — M79645 Pain in left finger(s): Secondary | ICD-10-CM

## 2022-12-19 DIAGNOSIS — S67191A Crushing injury of left index finger, initial encounter: Secondary | ICD-10-CM

## 2022-12-19 DIAGNOSIS — L03012 Cellulitis of left finger: Secondary | ICD-10-CM

## 2022-12-19 MED ORDER — NAPROXEN 500 MG PO TABS: 500.0000 mg | ORAL_TABLET | Freq: Two times a day (BID) | ORAL | 0 refills | Status: DC

## 2022-12-19 MED ORDER — DOXYCYCLINE HYCLATE 100 MG PO CAPS: 100.0000 mg | ORAL_CAPSULE | Freq: Two times a day (BID) | ORAL | 0 refills | Status: DC

## 2022-12-19 NOTE — Discharge Instructions (Addendum)
If your finger pain persists, please go see bone doctor

## 2022-12-19 NOTE — ED Triage Notes (Signed)
Chief Complaint: left pointer finger pain and swelling. States has a history of an injury there a long time ago. Pain and swelling increased over the last month.   Onset: 1 month   Prescriptions or OTC medications tried: Yes- tylenol     with mild relief

## 2022-12-19 NOTE — ED Provider Notes (Addendum)
[facility]    CSN: [id number] Arrival date & time: 12/19/22  1417      History   Chief Complaint Chief Complaint  Patient presents with   Hand Pain    Left pointer finger     HPI Shenise Sudduth is a 37 y.o. female who presents with swelling, redness and pain of L index finger which started 2 weeks ago. She also has unresolved pain on mid distal digit of L index where she shot the door 2 weeks ago, and never had an xray. The Doxy she took last time helped. Has family hx of ingrown nails.     Past Medical History:  Diagnosis Date   Diabetes mellitus    Gestational diabetes    Postpartum hemorrhage    Thrombocytopenia ([place])     Patient Active Problem List   Diagnosis Date Noted   Nexplanon in place 01/02/2021   Indication for care in labor or delivery 11/20/2020   SVD (spontaneous vaginal delivery) 11/20/2020   Precipitous delivery 11/20/2020   [redacted] weeks gestation of pregnancy 10/02/2020   Polyhydramnios affecting pregnancy 09/10/2020   History of postpartum hemorrhage 08/06/2020   Gestational diabetes 08/06/2020   Group B streptococcal bacteriuria 07/31/2020   Supervision of high risk pregnancy, antepartum 07/25/2020   AMA (advanced maternal age) multigravida 35+ 07/25/2020   Elevated blood pressure reading in office without diagnosis of hypertension 07/25/2020   Obesity in pregnancy 07/25/2020   Language barrier, cultural differences 11/30/2011   Obese 11/25/2011    Past Surgical History:  Procedure Laterality Date   NO PAST SURGERIES      OB History     Gravida  7   Para  6   Term  5   Preterm  1   AB  1   Living  6      SAB  1   IAB  0   Ectopic  0   Multiple  0   Live Births  6            Home Medications    Prior to Admission medications   Medication Sig Start Date End Date Taking? Authorizing Provider  acetaminophen (TYLENOL) 325 MG tablet Take 2 tablets (650 mg total) by mouth every 4 (four) hours as needed  (for pain scale < 4). 11/22/20  Yes Gladys Damme, MD  doxycycline (VIBRAMYCIN) 100 MG capsule Take 1 capsule (100 mg total) by mouth 2 (two) times daily. 12/19/22  Yes Rodriguez-Southworth, Sunday Spillers, PA-C  ibuprofen (ADVIL) 600 MG tablet Take 1 tablet (600 mg total) by mouth every 6 (six) hours as needed. 09/28/21  Yes Lamptey, Myrene Galas, MD  naproxen (NAPROSYN) 500 MG tablet Take 1 tablet (500 mg total) by mouth 2 (two) times daily. 12/19/22  Yes Rodriguez-Southworth, Sunday Spillers, PA-C  norgestimate-ethinyl estradiol (ORTHO-CYCLEN) 0.25-35 MG-MCG tablet Take 1 tablet by mouth daily. 01/02/21  Yes Clarnce Flock, MD  metFORMIN (GLUCOPHAGE) 1000 MG tablet Take 1 tablet (1,000 mg total) by mouth 2 (two) times daily with a meal. 01/03/21 12/29/21  Clarnce Flock, MD    Family History Family History  Problem Relation Age of Onset   Hypertension Father    Cancer Maternal Aunt        leaukemia   Diabetes Paternal Grandmother    Diabetes Mother    Hypertension Mother    Hyperlipidemia Mother     Social History Social History   Tobacco Use   Smoking status: Never   Smokeless tobacco: Never  Vaping Use   Vaping Use: Never used  Substance Use Topics   Alcohol use: No   Drug use: No     Allergies   Patient has no known allergies.   Review of Systems Review of Systems  Constitutional:  Negative for fever.  Musculoskeletal:  Positive for joint swelling. Negative for gait problem.  Skin:  Positive for color change. Negative for wound.     Physical Exam Triage Vital Signs ED Triage Vitals  Enc Vitals Group     BP 12/19/22 1455 119/82     Pulse Rate 12/19/22 1455 76     Resp 12/19/22 1455 16     Temp 12/19/22 1455 97.7 F (36.5 C)     Temp Source 12/19/22 1455 Oral     SpO2 12/19/22 1455 98 %     Weight 12/19/22 1454 213 lb (96.6 kg)     Height 12/19/22 1454 5' 7"$  (1.702 m)     Head Circumference --      Peak Flow --      Pain Score 12/19/22 1452 7     Pain Loc --      Pain  Edu? --      Excl. in [place]? --    No data found.  Updated Vital Signs BP 119/82 (BP Location: Left Arm)   Pulse 76   Temp 97.7 F (36.5 C) (Oral)   Resp 16   Ht 5' 7"$  (1.702 m)   Wt 213 lb (96.6 kg)   LMP 12/17/2022 (Approximate)   SpO2 98%   Breastfeeding No   BMI 33.36 kg/m   Visual Acuity Right Eye Distance:   Left Eye Distance:   Bilateral Distance:    Right Eye Near:   Left Eye Near:    Bilateral Near:     Physical Exam Vitals and nursing note reviewed.  Constitutional:      General: She is not in acute distress.    Appearance: She is not toxic-appearing.  HENT:     Right Ear: External ear normal.     Left Ear: External ear normal.  Eyes:     General: No scleral icterus.    Conjunctiva/sclera: Conjunctivae normal.  Musculoskeletal:        General: Tenderness present. Normal range of motion.     Cervical back: Neck supple.     Comments: Mid distal L index area looks normal, but is a little tender. The distal medial aspect is a little red, warm and swollen.   Skin:    General: Skin is warm and dry.  Neurological:     Mental Status: She is alert and oriented to person, place, and time.     Gait: Gait normal.  Psychiatric:        Mood and Affect: Mood normal.        Behavior: Behavior normal.        Thought Content: Thought content normal.        Judgment: Judgment normal.      UC Treatments / Results  Labs (all labs ordered are listed, but only abnormal results are displayed) Labs Reviewed - No data to display  EKG   Radiology DG Finger Index Left  Result Date: 12/19/2022 CLINICAL DATA:  Crush injury 2 months ago, second digit pain and swelling EXAM: LEFT INDEX FINGER 2+V COMPARISON:  None Available. FINDINGS: Frontal, oblique, and lateral views of the left second digit are obtained. No acute fracture, subluxation, or dislocation. Joint spaces are well preserved. Soft  tissues are unremarkable. IMPRESSION: 1. Unremarkable left second digit.  Electronically Signed   By: Randa Ngo M.D.   On: 12/19/2022 15:43    Procedures Procedures (including critical care time)  Medications Ordered in UC Medications - No data to display  Initial Impression / Assessment and Plan / UC Course  I have reviewed the triage vital signs and the nursing notes.  Pertinent imaging results that were available during my care of the patient were reviewed by me and considered in my medical decision making (see chart for details).  Paronychia L index finger Pain L mid index finger from trauma unresolved  I placed her on Doxy and Naprosyn as noted and advised to follow up with ortho if area of injury continued to hurt. Needs to make sure she gets referral from her PCP to see any specialist.    Final Clinical Impressions(s) / UC Diagnoses   Final diagnoses:  Paronychia of left index finger  Finger pain, left     Discharge Instructions      If your finger pain persists, please go see bone doctor     ED Prescriptions     Medication Sig Dispense Auth. Provider   doxycycline (VIBRAMYCIN) 100 MG capsule Take 1 capsule (100 mg total) by mouth 2 (two) times daily. 20 capsule Rodriguez-Southworth, Sunday Spillers, PA-C   naproxen (NAPROSYN) 500 MG tablet Take 1 tablet (500 mg total) by mouth 2 (two) times daily. 30 tablet Rodriguez-Southworth, Sunday Spillers, PA-C      PDMP not reviewed this encounter.   Shelby Mattocks, PA-C 12/19/22 1600    Rodriguez-Southworth, [place], [place] 12/19/22 1610

## 2023-04-15 ENCOUNTER — Other Ambulatory Visit: Payer: Self-pay | Admitting: Nurse Practitioner

## 2023-04-15 ENCOUNTER — Ambulatory Visit: Payer: Self-pay

## 2023-04-15 DIAGNOSIS — M25562 Pain in left knee: Secondary | ICD-10-CM

## 2023-04-15 DIAGNOSIS — M25561 Pain in right knee: Secondary | ICD-10-CM

## 2023-12-01 ENCOUNTER — Encounter (HOSPITAL_COMMUNITY): Payer: Self-pay

## 2023-12-01 ENCOUNTER — Ambulatory Visit (HOSPITAL_COMMUNITY)
Admission: EM | Admit: 2023-12-01 | Discharge: 2023-12-01 | Disposition: A | Discharge status: Home / Self Care | Payer: Medicaid Other

## 2023-12-01 DIAGNOSIS — M79645 Pain in left finger(s): Secondary | ICD-10-CM | POA: Diagnosis not present

## 2023-12-01 NOTE — ED Triage Notes (Signed)
InterpreterDorann Lodge [id number]  Patient having pain in the left pointer finger. Has been seen twice for this but onset last night the pain increased and she could not sleep. No known injuries. Has history of ingrown nails in her family.   Patient took Advil with no relief.

## 2023-12-01 NOTE — ED Provider Notes (Addendum)
[facility]    CSN: [id number] Arrival date & time: 12/01/23  0957      History   Chief Complaint Chief Complaint  Patient presents with   Hand Pain    Pointer finger left    HPI Elaine Kelly is a 38 y.o. female.   Patient presents with left index finger pain that she states began 2 years ago.  Patient states that she was seen here 1 year ago and given antibiotics and pain medicine that helped some, but states that the pain increased yesterday.  Denies any known injury.   Hand Pain    Past Medical History:  Diagnosis Date   Diabetes mellitus    Gestational diabetes    Postpartum hemorrhage    Thrombocytopenia (HCC)     Patient Active Problem List   Diagnosis Date Noted   Nexplanon in place 01/02/2021   Indication for care in labor or delivery 11/20/2020   SVD (spontaneous vaginal delivery) 11/20/2020   Precipitous delivery 11/20/2020   [redacted] weeks gestation of pregnancy 10/02/2020   Polyhydramnios affecting pregnancy 09/10/2020   History of postpartum hemorrhage 08/06/2020   Gestational diabetes 08/06/2020   Group B streptococcal bacteriuria 07/31/2020   Supervision of high risk pregnancy, antepartum 07/25/2020   AMA (advanced maternal age) multigravida 35+ 07/25/2020   Elevated blood pressure reading in office without diagnosis of hypertension 07/25/2020   Obesity in pregnancy 07/25/2020   Language barrier, cultural differences 11/30/2011   Obese 11/25/2011    Past Surgical History:  Procedure Laterality Date   NO PAST SURGERIES      OB History     Gravida  7   Para  6   Term  5   Preterm  1   AB  1   Living  6      SAB  1   IAB  0   Ectopic  0   Multiple  0   Live Births  6            Home Medications    Prior to Admission medications   Medication Sig Start Date End Date Taking? Authorizing Provider  ibuprofen (ADVIL) 600 MG tablet Take 1 tablet (600 mg total) by mouth every 6 (six) hours as needed. 09/28/21   Yes Lamptey, Britta Mccreedy, MD  Etonogestrel [facility]) Inject into the skin.    [provider]    Family History Family History  Problem Relation Age of Onset   Hypertension Father    Cancer Maternal Aunt        leaukemia   Diabetes Paternal Grandmother    Diabetes Mother    Hypertension Mother    Hyperlipidemia Mother     Social History Social History   Tobacco Use   Smoking status: Never   Smokeless tobacco: Never  Vaping Use   Vaping status: Never Used  Substance Use Topics   Alcohol use: No   Drug use: No     Allergies   Patient has no known allergies.   Review of Systems Review of Systems  All other systems reviewed and are negative.    Physical Exam Triage Vital Signs ED Triage Vitals  Encounter Vitals Group     BP 12/01/23 1130 102/80     Systolic BP Percentile --      Diastolic BP Percentile --      Pulse Rate 12/01/23 1130 84     Resp 12/01/23 1130 16     Temp 12/01/23 1130 98.6  F (37 C)     Temp Source 12/01/23 1130 Oral     SpO2 12/01/23 1130 98 %     Weight 12/01/23 1129 220 lb (99.8 kg)     Height 12/01/23 1129 5\' 7"  (1.702 m)     Head Circumference --      Peak Flow --      Pain Score 12/01/23 1126 8     Pain Loc --      Pain Education --      Exclude from Growth Chart --    No data found.  Updated Vital Signs BP 102/80 (BP Location: Left Arm)   Pulse 84   Temp 98.6 F (37 C) (Oral)   Resp 16   Ht 5\' 7"  (1.702 m)   Wt 220 lb (99.8 kg)   LMP 11/02/2023 (Approximate)   SpO2 98%   BMI 34.46 kg/m   Visual Acuity Right Eye Distance:   Left Eye Distance:   Bilateral Distance:    Right Eye Near:   Left Eye Near:    Bilateral Near:     Physical Exam Vitals and nursing note reviewed.  Constitutional:      General: She is awake. She is not in acute distress.    Appearance: Normal appearance. She is well-developed and well-groomed. She is not ill-appearing.  Musculoskeletal:     Right hand: Normal.     Left  hand: Tenderness present. No swelling or deformity.     Comments: Mild tenderness noted to medial aspect of distal left index finger without redness and swelling.   Neurological:     Mental Status: She is alert.  Psychiatric:        Behavior: Behavior is cooperative.      UC Treatments / Results  Labs (all labs ordered are listed, but only abnormal results are displayed) Labs Reviewed - No data to display  EKG   Radiology No results found.  Procedures Procedures (including critical care time)  Medications Ordered in UC Medications - No data to display  Initial Impression / Assessment and Plan / UC Course  I have reviewed the triage vital signs and the nursing notes.  Pertinent labs & imaging results that were available during my care of the patient were reviewed by me and considered in my medical decision making (see chart for details).     Patient presented with 2-year history of left index finger pain.  Patient was seen here 1 year ago and diagnosed with paronychia and given antibiotics and pain medicine.  Patient states that this helped, but the pain still continued.  Patient states the pain became worse yesterday.  Upon assessment patient has mild tenderness noted to the medial aspect of distal left index finger without redness and swelling.  No obvious signs of ingrown fingernail or infection.  Patient states that she will like someone to follow-up regarding recurrent ingrown fingernail concerns.  Given a dermatologist to follow-up with.  Discussed over-the-counter medication for symptoms.  Discussed return precautions. Final Clinical Impressions(s) / UC Diagnoses   Final diagnoses:  Finger pain, left     Discharge Instructions      Recommend alternating between Tylenol and Aleve every 4-6 hours as needed for pain.  You can also alternate with ice and heat.  I have attached a dermatologist she can follow-up with regarding concerns for recurrent ingrown  fingernails.  Return here as needed.  ???? ???????? ??? Tylenol ?Aleve ?? 4-6 ????? ??? ?????? ?????.  ????? ????? ??????? ?? ????? ????????.  ??? ????? ????? ????? ????? ?????? ???????? ???? ???? ????? ???????? ???????? ???????? ??????? ????????.  ?????? ??? ??? ??????.  yusaa bialtanawub bayn Tylenol waAleve kuli 4-6 saeat hasab alhajat lil'almi. yumkin[place] aydan altanawub mae althalj walhararati. laqad 'urfiqat tabibat 'amrad jildiat yumkinuha almutabaeat maeaha fima yataealaq bialmakhawif almutaealiqat bial'azafir alnaashibat almutakarirati. aleawdat huna hasab alhajati.    ED Prescriptions   None    PDMP not reviewed this encounter.   Letta Kocher, NP 12/01/23 1224    Wynonia Lawman A, NP 12/01/23 1225

## 2023-12-01 NOTE — Discharge Instructions (Addendum)
Recommend alternating between Tylenol and Aleve every 4-6 hours as needed for pain.  You can also alternate with ice and heat.  I have attached a dermatologist she can follow-up with regarding concerns for recurrent ingrown fingernails.  Return here as needed.  ???? ???????? ??? Tylenol ?Aleve ?? 4-6 ????? ??? ?????? ?????.  ????? ????? ??????? ?? ????? ????????.  ??? ????? ????? ????? ????? ?????? ???????? ???? ???? ????? ???????? ???????? ???????? ??????? ????????.  ?????? ??? ??? ??????. yusaa bialtanawub bayn Tylenol waAleve kuli 4-6 saeat hasab alhajat lil'almi. yum[place] aydan altanawub mae althalj walhararati. laqad 'urfiqat tabibat 'amrad jildiat yumkinuha almutabaeat maeaha fima yataealaq bialmakhawif almutaealiqat bial'azafir alnaashibat almutakarirati. aleawdat huna hasab alhajati.

## 2023-12-30 ENCOUNTER — Other Ambulatory Visit: Payer: Self-pay

## 2023-12-30 ENCOUNTER — Ambulatory Visit: Payer: Medicaid Other | Admitting: Obstetrics and Gynecology

## 2023-12-30 ENCOUNTER — Encounter: Payer: Self-pay | Admitting: Obstetrics and Gynecology

## 2023-12-30 VITALS — BP 127/86 | HR 97 | Wt 236.1 lb

## 2023-12-30 DIAGNOSIS — Z3046 Encounter for surveillance of implantable subdermal contraceptive: Secondary | ICD-10-CM

## 2023-12-30 DIAGNOSIS — Z3202 Encounter for pregnancy test, result negative: Secondary | ICD-10-CM | POA: Diagnosis not present

## 2023-12-30 DIAGNOSIS — Z3009 Encounter for other general counseling and advice on contraception: Secondary | ICD-10-CM | POA: Diagnosis not present

## 2023-12-30 LAB — CBC
Hematocrit: 39.2 % (ref 34.0–46.6)
Hemoglobin: 12.7 g/dL (ref 11.1–15.9)
MCH: 27.7 pg (ref 26.6–33.0)
MCHC: 32.4 g/dL (ref 31.5–35.7)
MCV: 85 fL (ref 79–97)
Platelets: 284 10*3/uL (ref 150–450)
RBC: 4.59 x10E6/uL (ref 3.77–5.28)
RDW: 12.3 % (ref 11.7–15.4)
WBC: 8.6 10*3/uL (ref 3.4–10.8)

## 2023-12-30 LAB — POCT PREGNANCY, URINE: Preg Test, Ur: NEGATIVE

## 2023-12-30 MED ORDER — NORETHINDRONE 0.35 MG PO TABS
1.0000 | ORAL_TABLET | Freq: Every day | ORAL | 11 refills | Status: AC
Start: 2023-12-30 — End: ?

## 2023-12-30 NOTE — Progress Notes (Signed)
 GYNECOLOGY VISIT  Patient name: Elaine Kelly MRN [id number]  Date of birth: 1986-01-05 Chief Complaint:   Procedure   History:  Elaine Kelly is a 38 y.o. [id number] being seen today for nexplanon removal and would like to switch contraceptive methods. Nexplanon has been in placed 3 years. Has previously used pills and it worked well for her. Used IUD previoulsy, after 1 year it was expulsed. She has used the depo injection once due to weight concerns.  CHC contraindications:   none Asked about non-hormonal forms of contraception.    Past Medical History:  Diagnosis Date   Diabetes mellitus    Gestational diabetes    Postpartum hemorrhage    Thrombocytopenia (HCC)     Past Surgical History:  Procedure Laterality Date   NO PAST SURGERIES      The following portions of the patient's history were reviewed and updated as appropriate: allergies, current medications, past family history, past medical history, past social history, past surgical history and problem list.   Health Maintenance:   Last pap     Component Value Date/Time   DIAGPAP  08/06/2020 1456    - Negative for intraepithelial lesion or malignancy (NILM)   HPVHIGH Negative 08/06/2020 1456   ADEQPAP  08/06/2020 1456    Satisfactory for evaluation; transformation zone component PRESENT.    High Risk HPV: Positive  Adequacy:  Satisfactory for evaluation, transformation zone component PRESENT  Diagnosis:  Atypical squamous cells of undetermined significance (ASC-US)  Last mammogram: n/a   Review of Systems:  Pertinent items are noted in HPI. Comprehensive review of systems was otherwise negative.   Objective:  Physical Exam BP 127/86   Pulse 97   Wt 236 lb 1.6 oz (107.1 kg)   LMP 12/24/2023 (Approximate)   BMI 36.98 kg/m    Physical Exam Vitals and nursing note reviewed.  Constitutional:      Appearance: Normal appearance.  HENT:     Head: Normocephalic and atraumatic.  Pulmonary:      Effort: Pulmonary effort is normal.  Skin:    General: Skin is warm and dry.  Neurological:     General: No focal deficit present.     Mental Status: She is alert.  Psychiatric:        Mood and Affect: Mood normal.        Behavior: Behavior normal.        Thought Content: Thought content normal.        Judgment: Judgment normal.      NEXPLANON REMOVAL The risks (including infection, bleeding, pain, and uterine perforation) and benefits of the procedure were explained to the patient and Written informed consent was obtained.   Device was palpated in left upper arm. After time out, the skin was cleaned with alcohol and infiltrated with 1cc of 1% lidocaine with epinephrine was used to infiltrate the skin and subcutaneous tissue deep to the device. The area was cleaned with betadine x3.  Using an 11 blade, the skin was incised and the implant was removed intact.  The implant was shown to the patient.  The skin was cleaned, incision covered with Steri-Strips, and an adhesive bandage.  Arm was wrapped and post procedure instructions provided to the patient.  POPs chosen as new contraceptive method.      Assessment & Plan:   1. Encounter for counseling regarding contraception (Primary) Reviewed all forms of birth control options available including abstinence; over the counter/barrier methods; hormonal contraceptive medication including pill, patch, ring,  injection,contraceptive implant; hormonal and nonhormonal IUDs; permanent sterilization options including vasectomy and the various tubal sterilization modalities. Risks and benefits reviewed.  Questions were answered.  Information was given to patient to review.   Will start POPs for contraception. No CHC contraindications. Patient expressed concern regarding use of hormones for contraception, will start with POPs and if unsatisfied can switch to [facility].   2. Encounter for Nexplanon removal Now s/p uncomplicated nexplanon removal    Routine  preventative health maintenance measures emphasized.  Lorriane Shire, MD [facility] for [facility], [facility]

## 2023-12-30 NOTE — Patient Instructions (Signed)
 Your NEXPLANON implant was just removed Here is some helpful information on what you can expect and how to care for your removal site. 24 Hours wear your top bandage The compression bandage helps minimize bruising.  3-5 Days keep your implant site covered While the insertion site is healing, keep the area covered with a smaller bandage.

## 2024-01-05 ENCOUNTER — Encounter: Payer: Self-pay | Admitting: Obstetrics and Gynecology

## 2024-01-27 ENCOUNTER — Ambulatory Visit: Payer: Medicaid Other | Admitting: Obstetrics and Gynecology

## 2024-01-27 ENCOUNTER — Telehealth (INDEPENDENT_AMBULATORY_CARE_PROVIDER_SITE_OTHER): Payer: Self-pay | Admitting: Primary Care

## 2024-01-27 NOTE — Telephone Encounter (Signed)
 Called pt to reschedule appt. Pt did not answer and VM left.

## 2024-02-11 ENCOUNTER — Ambulatory Visit (INDEPENDENT_AMBULATORY_CARE_PROVIDER_SITE_OTHER): Payer: Medicaid Other | Admitting: Primary Care

## 2024-02-26 ENCOUNTER — Ambulatory Visit (INDEPENDENT_AMBULATORY_CARE_PROVIDER_SITE_OTHER): Admitting: Urgent Care

## 2024-02-26 ENCOUNTER — Encounter: Payer: Self-pay | Admitting: Urgent Care

## 2024-02-26 VITALS — BP 116/78 | HR 88 | Ht 66.0 in | Wt 231.1 lb

## 2024-02-26 DIAGNOSIS — G44209 Tension-type headache, unspecified, not intractable: Secondary | ICD-10-CM

## 2024-02-26 DIAGNOSIS — Z8632 Personal history of gestational diabetes: Secondary | ICD-10-CM

## 2024-02-26 DIAGNOSIS — L608 Other nail disorders: Secondary | ICD-10-CM | POA: Diagnosis not present

## 2024-02-26 DIAGNOSIS — R519 Headache, unspecified: Secondary | ICD-10-CM | POA: Diagnosis not present

## 2024-02-26 DIAGNOSIS — M545 Low back pain, unspecified: Secondary | ICD-10-CM

## 2024-02-26 DIAGNOSIS — G8929 Other chronic pain: Secondary | ICD-10-CM

## 2024-02-26 DIAGNOSIS — R52 Pain, unspecified: Secondary | ICD-10-CM

## 2024-02-26 MED ORDER — CYCLOBENZAPRINE HCL 10 MG PO TABS: 10.0000 mg | ORAL_TABLET | Freq: Three times a day (TID) | ORAL | 0 refills | Status: AC | PRN

## 2024-02-26 MED ORDER — MUPIROCIN 2 % EX OINT: 1.0000 | TOPICAL_OINTMENT | Freq: Three times a day (TID) | CUTANEOUS | 0 refills | Status: DC

## 2024-02-26 MED ORDER — BUTALBITAL-APAP-CAFFEINE 50-325-40 MG PO TABS: 1.0000 | ORAL_TABLET | Freq: Four times a day (QID) | ORAL | 0 refills | Status: AC | PRN

## 2024-02-26 NOTE — Progress Notes (Signed)
 New Patient Office Visit  Subjective:  Patient ID: Elaine Kelly, female    DOB: 1986/05/17  Age: 38 y.o. MRN: [id number]  CC:  Chief Complaint  Patient presents with   Establish Care    New pt. Est care. She had gestational diabetes while pregnant and wants to make sure its better. She has also been having body pain and headaches for about 2 months.    HPI Elaine Kelly presents to establish care.  Discussed the use of AI scribe software for clinical note transcription with the patient, who gave verbal consent to proceed. A professional translator is present and assisting with visit.  History of Present Illness   Elaine Kelly is a 38 year old female who presents with body pain and backache.  She has been experiencing severe, continuous body pain for the past two months, described as 'pressure-like'. The pain is managed with Tylenol  and Aleve , though the effectiveness is not specified.  The backache began a few days ago, is intermittent, and worsens with prolonged standing. There is no burning or shooting pain down the legs, and no issues with urination.  She experiences headaches that sometimes radiate to the neck, associated with tension in the shoulders and neck. The headaches do not extend behind the eyes.  She recently removed her Nexplanon  implant due to prolonged menstrual bleeding and severe cramps, which have since improved. She is currently on norethindrone  birth control pills.  She reports a toenail issue with spooning, which may indicate iron deficiency. She clips her nails straight but rounds the sides, which may contribute to the problem.  She has a history of gestational diabetes from her pregnancy three years ago, initially managed with oral medication and later with insulin . Postpartum glucose levels were normal, but she has not had follow-up testing since then.  Both parents have high cholesterol. She engages in household activities but does not have a  specific exercise routine.       Outpatient Encounter Medications as of 02/26/2024  Medication Sig   butalbital -acetaminophen -caffeine  (FIORICET) 50-325-40 MG tablet Take 1 tablet by mouth every 6 (six) hours as needed for headache. Do not exceed 4 in a week   cyclobenzaprine  (FLEXERIL ) 10 MG tablet Take 1 tablet (10 mg total) by mouth 3 (three) times daily as needed for muscle spasms.   ibuprofen  (ADVIL ) 600 MG tablet Take 1 tablet (600 mg total) by mouth every 6 (six) hours as needed.   mupirocin  ointment (BACTROBAN ) 2 % Apply 1 Application topically 3 (three) times daily.   norethindrone  (MICRONOR ) 0.35 MG tablet Take 1 tablet (0.35 mg total) by mouth daily.   [DISCONTINUED] Etonogestrel  (NEXPLANON  Maceo) Inject into the skin. (Patient not taking: Reported on 02/26/2024)   No facility-administered encounter medications on file as of 02/26/2024.    Past Medical History:  Diagnosis Date   Diabetes mellitus    Gestational diabetes    Postpartum hemorrhage    Thrombocytopenia (HCC)     Past Surgical History:  Procedure Laterality Date   NO PAST SURGERIES      Family History  Problem Relation Age of Onset   Hypertension Father    Cancer Maternal Aunt        leaukemia   Diabetes Paternal Grandmother    Diabetes Mother    Hypertension Mother    Hyperlipidemia Mother     Social History   Socioeconomic History   Marital status: Married    Spouse name: Not on file   Number of children: Not  on file   Years of education: Not on file   Highest education level: Not on file  Occupational History   Not on file  Tobacco Use   Smoking status: Never   Smokeless tobacco: Never  Vaping Use   Vaping status: Never Used  Substance and Sexual Activity   Alcohol use: No   Drug use: No   Sexual activity: Yes    Birth control/protection: Implant  Other Topics Concern   Not on file  Social History Narrative   ** Merged History Encounter **       Social Drivers of Health    Financial Resource Strain: Not on file  Food Insecurity: No Food Insecurity (12/30/2023)   Hunger Vital Sign    Worried About Running Out of Food in the Last Year: Never true    Ran Out of Food in the Last Year: Never true  Transportation Needs: No Transportation Needs (12/30/2023)   PRAPARE - Administrator, Civil Service (Medical): No    Lack of Transportation (Non-Medical): No  Physical Activity: Not on file  Stress: Not on file  Social Connections: Not on file  Intimate Partner Violence: Not At Risk (07/25/2020)   Humiliation, Afraid, Rape, and Kick questionnaire    Fear of Current or Ex-Partner: No    Emotionally Abused: No    Physically Abused: No    Sexually Abused: No    ROS: as noted in HPI  Objective:  BP 116/78   Pulse 88   Ht 5\' 6"  (1.676 m)   Wt 231 lb 1.9 oz (104.8 kg)   SpO2 96%   BMI 37.30 kg/m   Physical Exam Vitals and nursing note reviewed. Exam conducted with a chaperone present Musician).  Constitutional:      General: She is not in acute distress.    Appearance: Normal appearance. She is obese. She is not ill-appearing, toxic-appearing or diaphoretic.  HENT:     Head: Normocephalic and atraumatic.     Right Ear: Tympanic membrane, ear canal and external ear normal. There is no impacted cerumen.     Left Ear: Tympanic membrane, ear canal and external ear normal. There is no impacted cerumen.     Nose: Nose normal.     Mouth/Throat:     Mouth: Mucous membranes are moist.     Pharynx: Oropharynx is clear. No oropharyngeal exudate or posterior oropharyngeal erythema.  Eyes:     General: No scleral icterus.       Right eye: No discharge.        Left eye: No discharge.     Extraocular Movements: Extraocular movements intact.     Pupils: Pupils are equal, round, and reactive to light.  Neck:     Thyroid: No thyroid mass, thyromegaly or thyroid tenderness.  Cardiovascular:     Rate and Rhythm: Normal rate and regular rhythm.      Pulses: Normal pulses.     Heart sounds: No murmur heard. Pulmonary:     Effort: Pulmonary effort is normal. No respiratory distress.     Breath sounds: Normal breath sounds. No stridor. No wheezing or rhonchi.  Abdominal:     General: Abdomen is flat. Bowel sounds are normal. There is no distension.     Palpations: Abdomen is soft. There is no mass.     Tenderness: There is no abdominal tenderness. There is no guarding.  Musculoskeletal:        General: Tenderness (large knots to B traps; paralumbar  muscles tender) present.     Cervical back: Normal range of motion and neck supple. No rigidity or tenderness (knots to paracervicals and traps).     Right lower leg: No edema.     Left lower leg: No edema.  Lymphadenopathy:     Cervical: No cervical adenopathy.  Skin:    General: Skin is warm and dry.     Coloration: Skin is not jaundiced.     Findings: No bruising, erythema or rash.  Neurological:     General: No focal deficit present.     Mental Status: She is alert and oriented to person, place, and time.     Sensory: No sensory deficit.     Motor: No weakness.  Psychiatric:        Mood and Affect: Mood normal.        Behavior: Behavior normal.       Assessment & Plan:  Nonintractable episodic headache, unspecified headache type  Acute generalized body pain -     CBC with Differential/Platelet -     TSH -     Comprehensive metabolic panel with GFR -     CK -     Sedimentation rate -     Magnesium -     Cyclobenzaprine  HCl; Take 1 tablet (10 mg total) by mouth 3 (three) times daily as needed for muscle spasms.  Dispense: 30 tablet; Refill: 0  Chronic bilateral low back pain without sciatica  History of gestational diabetes -     Hemoglobin A1c  Acute non intractable tension-type headache -     Butalbital -APAP-Caffeine ; Take 1 tablet by mouth every 6 (six) hours as needed for headache. Do not exceed 4 in a week  Dispense: 14 tablet; Refill: 0 -     Cyclobenzaprine   HCl; Take 1 tablet (10 mg total) by mouth 3 (three) times daily as needed for muscle spasms.  Dispense: 30 tablet; Refill: 0  Nail pitting -     Iron, TIBC and Ferritin Panel -     Mupirocin ; Apply 1 Application topically 3 (three) times daily.  Dispense: 22 g; Refill: 0  Assessment and Plan    Back pain Chronic back pain likely muscular due to poor posture and muscle tension. - Recommend lumbar back brace with belt and mesh for posture support. - Advise on posture correction by engaging core muscles and straightening the back. - Suggest microwavable heat pack for muscle relaxation. - Prescribe muscle relaxer for muscle tension. - Schedule follow-up in two weeks to assess response to treatment.  Muscle tension and tension headache Tension headaches secondary to neck and shoulder muscle tension. - Recommend microwavable heat pack for neck and shoulder tension. - Prescribe muscle relaxer to alleviate muscle tension. - Prescribe butalbital  for severe headaches, cautioning not to exceed four doses per week to avoid rebound headaches. - Provide instructions on self-massage techniques for muscle knots.  Fingernail changes, possible developing paronychia to R pointer finger Suspected iron deficiency anemia due to nail changes and spooning - Order blood tests to check iron levels and assess for anemia. - use OTC epsom salt soaks, then apply topical mupirocin .  Gestational diabetes mellitus Concern for possible current diabetes due to history and symptoms. - Order blood tests to check glucose levels to assess for current diabetes. - If glucose levels indicate diabetes, contact her with results prior to follow-up appointment.  General Health Maintenance Family history of high cholesterol indicates potential risk. - Advise fasting for 8-12 hours prior to  cholesterol testing to ensure accurate results. - Plan cholesterol testing at a future appointment when fasting is  possible.  Follow-up Plan to review blood work results and assess response to treatment for back pain and tension headaches. - Schedule follow-up appointment in two weeks to review blood work results and treatment response.        No follow-ups on file.   Mandy Second, PA

## 2024-02-26 NOTE — Patient Instructions (Addendum)
 You have cervical trigger point, which is knots in the muscles of the neck. Please apply a warm moist compress, such as a microwavable heating pack, to your neck several times daily. After each warm compress, apply the technique that we discussed today of ischemic release. This is a prolonged, deep pressure into the knot of the muscle to release the tension.  Take the muscle relaxer three times daily as needed. Keep in mind it may make you feel tired or drowsy, so do not operate machinery or drive a car until you know how it affects you.  You can also take the Fioricet as needed for tension headaches, but do not exceed 4 in a week to prevent rebound headaches.  If your symptoms persist, you would be a candidate for trigger point injection or dry needling.  Try to stay hydrated with WATER as dehydration and caffeine  intake can worsen this condition.  If you develop worsening pain, fever, or any new symptoms, please return to the clinic.     Lumbar support brace with mesh - [employer] $17  Purchase epsom salts and soak the finger in warm water with the salt. Then apply the cream provided today topically to affected area of the nail bed.   ???? ???? ???? ?????? ??? ??? ?? ????? ??????.  ???? ??? ????? ????? ?????? ??? ????? ????? ????? ??????? ?? ??????????? ??? ????? ??? ???? ??????.  ??? ?? ????? ?????? ???? ????? ??????? ???????? ???? ???????? ?????. ??? ????? ?? ??? ???? ????? ??? ???? ?????? ?????? ??????.  ????? ????? ??????? ???? ???? ?????? ??? ??????. ????? ??? ?? ?????? ?????? ?? ??????? ??? ?? ?????? ?????? ?? ???? ??????? ??? ????? ?????? ????.  ????? ????? ????? ???????? ??? ?????? ????? ???? ??????? ???? ?? ?????? 4 ????? ???????? ??????? ?? ?????? ?????????.  ??? ?????? ???????? ??? ???? ???????? ???? ???? ?????? ?? ????? ?????? ??????.  ???? ?????? ??? ????? ???? ??????? ??? ?????? ?????? ???????? ????? ?? ?????? ??? ??????.  ??? ???? ???? ???????? ?? ???? ?? ?? ????? ??????  ????? ?????? ??? ???????.  ????? ????? ?? ???? - ?????? ?? ???????  ????? ????? ????? ????? ????? ?? ??? ???? ?? ?????. ?? ?? ?????? ??????? ????? ??????? ??? ??????? ??????? ?? ???? ?????.

## 2024-02-27 LAB — CBC WITH DIFFERENTIAL/PLATELET
Absolute Lymphocytes: 2802 {cells}/uL (ref 850–3900)
Absolute Monocytes: 502 {cells}/uL (ref 200–950)
Basophils Absolute: 50 {cells}/uL (ref 0–200)
Basophils Relative: 0.8 %
Eosinophils Absolute: 130 {cells}/uL (ref 15–500)
Eosinophils Relative: 2.1 %
HCT: 40 % (ref 35.0–45.0)
Hemoglobin: 13.2 g/dL (ref 11.7–15.5)
MCH: 27.7 pg (ref 27.0–33.0)
MCHC: 33 g/dL (ref 32.0–36.0)
MCV: 83.9 fL (ref 80.0–100.0)
MPV: 11.6 fL (ref 7.5–12.5)
Monocytes Relative: 8.1 %
Neutro Abs: 2716 {cells}/uL (ref 1500–7800)
Neutrophils Relative %: 43.8 %
Platelets: 253 10*3/uL (ref 140–400)
RBC: 4.77 10*6/uL (ref 3.80–5.10)
RDW: 11.9 % (ref 11.0–15.0)
Total Lymphocyte: 45.2 %
WBC: 6.2 10*3/uL (ref 3.8–10.8)

## 2024-02-27 LAB — HEMOGLOBIN A1C
Hgb A1c MFr Bld: 6.9 % — ABNORMAL HIGH (ref <5.7)
Mean Plasma Glucose: 151 mg/dL
eAG (mmol/L): 8.4 mmol/L

## 2024-02-27 LAB — COMPREHENSIVE METABOLIC PANEL WITH GFR
AG Ratio: 1.5 (calc) (ref 1.0–2.5)
ALT: 12 U/L (ref 6–29)
AST: 10 U/L (ref 10–30)
Albumin: 4 g/dL (ref 3.6–5.1)
Alkaline phosphatase (APISO): 72 U/L (ref 31–125)
BUN: 11 mg/dL (ref 7–25)
CO2: 27 mmol/L (ref 20–32)
Calcium: 9.2 mg/dL (ref 8.6–10.2)
Chloride: 103 mmol/L (ref 98–110)
Creat: 0.62 mg/dL (ref 0.50–0.97)
Globulin: 2.6 g/dL (ref 1.9–3.7)
Glucose, Bld: 145 mg/dL — ABNORMAL HIGH (ref 65–99)
Potassium: 4.6 mmol/L (ref 3.5–5.3)
Sodium: 137 mmol/L (ref 135–146)
Total Bilirubin: 0.2 mg/dL (ref 0.2–1.2)
Total Protein: 6.6 g/dL (ref 6.1–8.1)
eGFR: 116 mL/min/{1.73_m2} (ref >=60)

## 2024-02-27 LAB — CK: Total CK: 46 U/L (ref 20–239)

## 2024-02-27 LAB — IRON,TIBC AND FERRITIN PANEL
%SAT: 12 % — ABNORMAL LOW (ref 16–45)
Ferritin: 35 ng/mL (ref 16–154)
Iron: 55 ug/dL (ref 40–190)
TIBC: 449 ug/dL (ref 250–450)

## 2024-02-27 LAB — SEDIMENTATION RATE: Sed Rate: 6 mm/h (ref 0–20)

## 2024-02-27 LAB — MAGNESIUM: Magnesium: 2 mg/dL (ref 1.5–2.5)

## 2024-02-27 LAB — TSH: TSH: 0.52 m[IU]/L

## 2024-02-29 ENCOUNTER — Other Ambulatory Visit: Payer: Self-pay | Admitting: Urgent Care

## 2024-02-29 ENCOUNTER — Encounter: Payer: Self-pay | Admitting: Urgent Care

## 2024-02-29 DIAGNOSIS — E119 Type 2 diabetes mellitus without complications: Secondary | ICD-10-CM

## 2024-02-29 MED ORDER — METFORMIN HCL 500 MG PO TABS: 500.0000 mg | ORAL_TABLET | Freq: Two times a day (BID) | ORAL | 0 refills | Status: DC

## 2024-03-11 ENCOUNTER — Ambulatory Visit (INDEPENDENT_AMBULATORY_CARE_PROVIDER_SITE_OTHER): Admitting: Urgent Care

## 2024-03-11 VITALS — BP 110/77 | HR 87 | Wt 235.1 lb

## 2024-03-11 DIAGNOSIS — R52 Pain, unspecified: Secondary | ICD-10-CM

## 2024-03-11 DIAGNOSIS — E11618 Type 2 diabetes mellitus with other diabetic arthropathy: Secondary | ICD-10-CM

## 2024-03-11 DIAGNOSIS — Z7985 Long-term (current) use of injectable non-insulin antidiabetic drugs: Secondary | ICD-10-CM

## 2024-03-11 DIAGNOSIS — R519 Headache, unspecified: Secondary | ICD-10-CM

## 2024-03-11 MED ORDER — SEMAGLUTIDE(0.25 OR 0.5MG/DOS) 2 MG/3ML ~~LOC~~ SOPN: 0.2500 mg | PEN_INJECTOR | SUBCUTANEOUS | 0 refills | Status: DC

## 2024-03-11 NOTE — Progress Notes (Unsigned)
   Established Patient Office Visit  Subjective:  Patient ID: Elaine Kelly, female    DOB: 02-13-1986  Age: 38 y.o. MRN: [id number]  Chief Complaint  Patient presents with   Follow-up    2 week follow up. She would like to discuss weight gain.    HPI  {History (Optional):23778}  ROS: as noted in HPI  Objective:     BP 110/77   Pulse 87   Wt 235 lb 1.9 oz (106.6 kg)   SpO2 100%   BMI 37.95 kg/m  BP Readings from Last 3 Encounters:  03/11/24 110/77  02/26/24 116/78  12/30/23 127/86   Wt Readings from Last 3 Encounters:  03/11/24 235 lb 1.9 oz (106.6 kg)  02/26/24 231 lb 1.9 oz (104.8 kg)  12/30/23 236 lb 1.6 oz (107.1 kg)      Physical Exam   No results found for any visits on 03/11/24.  {Labs (Optional):23779}  The ASCVD Risk score (Arnett DK, et al., 2019) failed to calculate for the following reasons:   The 2019 ASCVD risk score is only valid for ages 96 to 36  Assessment & Plan:  There are no diagnoses linked to this encounter.   No follow-ups on file.   Mandy Second, PA

## 2024-03-11 NOTE — Patient Instructions (Signed)
 Return in four weeks for follow-up and recheck. Please come fasting for lipid panel labs.  Start the ozempic injection. Start 0.25mg  once weekly. STOP metformin . Read the attached handout regarding diabetes self care

## 2024-03-13 ENCOUNTER — Encounter: Payer: Self-pay | Admitting: Urgent Care

## 2024-03-18 ENCOUNTER — Telehealth: Payer: Self-pay

## 2024-03-18 ENCOUNTER — Other Ambulatory Visit (HOSPITAL_COMMUNITY): Payer: Self-pay

## 2024-03-18 NOTE — Telephone Encounter (Signed)
 Pharmacy Patient Advocate Encounter  Received notification from [facility] Medicaid that Prior Authorization for Ozempic (0.25 or 0.5 MG/DOSE) 2MG /3ML pen-injectors has been APPROVED from 03/18/24 to 03/18/25. Ran test claim, Copay is $4. This test claim was processed through [facility]- copay amounts may vary at other pharmacies due to pharmacy/plan contracts, or as the patient moves through the different stages of their insurance plan.   PA #/Case ID/Reference #: [id number]

## 2024-03-18 NOTE — Telephone Encounter (Signed)
 Pharmacy Patient Advocate Encounter   Received notification from CoverMyMeds that prior authorization for Ozempic (0.25 or 0.5 MG/DOSE) 2MG /3ML pen-injectors is required/requested.   Insurance verification completed.   The patient is insured through [facility] [place] .   Per test claim: PA required; PA submitted to above mentioned insurance via CoverMyMeds Key/confirmation #/EOC [id number] Status is pending

## 2024-04-08 ENCOUNTER — Encounter: Payer: Self-pay | Admitting: Urgent Care

## 2024-04-08 ENCOUNTER — Ambulatory Visit (INDEPENDENT_AMBULATORY_CARE_PROVIDER_SITE_OTHER): Admitting: Urgent Care

## 2024-04-08 VITALS — BP 92/67 | HR 94 | Wt 234.1 lb

## 2024-04-08 DIAGNOSIS — Z7985 Long-term (current) use of injectable non-insulin antidiabetic drugs: Secondary | ICD-10-CM | POA: Diagnosis not present

## 2024-04-08 DIAGNOSIS — M79645 Pain in left finger(s): Secondary | ICD-10-CM

## 2024-04-08 DIAGNOSIS — E11618 Type 2 diabetes mellitus with other diabetic arthropathy: Secondary | ICD-10-CM | POA: Diagnosis not present

## 2024-04-08 DIAGNOSIS — R609 Edema, unspecified: Secondary | ICD-10-CM | POA: Diagnosis not present

## 2024-04-08 MED ORDER — LIDOCAINE 5 % EX OINT: 1.0000 | TOPICAL_OINTMENT | Freq: Every day | CUTANEOUS | 0 refills | Status: DC | PRN

## 2024-04-08 MED ORDER — BLOOD GLUCOSE MONITORING SUPPL DEVI: 0 refills | Status: AC

## 2024-04-08 MED ORDER — SEMAGLUTIDE(0.25 OR 0.5MG/DOS) 2 MG/3ML ~~LOC~~ SOPN: 0.5000 mg | PEN_INJECTOR | SUBCUTANEOUS | 0 refills | Status: DC

## 2024-04-08 MED ORDER — BLOOD GLUCOSE TEST VI STRP: ORAL_STRIP | 3 refills | Status: AC

## 2024-04-08 MED ORDER — LANCET DEVICE MISC: 0 refills | Status: DC

## 2024-04-08 MED ORDER — LANCETS MISC. MISC: 20 refills | Status: AC

## 2024-04-08 NOTE — Patient Instructions (Addendum)
   Please purchase compression socks. Wear these upon awakening to prevent fluid in your legs.  Please increase your ozempic  to 0.5mg  once weekly. Please monitor your glucose 3-4 x/ week. Fasting glucose goal is 70 - <120 2 hours after meal goal is <140.  Please follow up with the hand specialist to further determine the cause of your pain. Please use the topical lidocaine  as needed to the affected area of your finger.   Please schedule a follow up in 2 months to recheck your A1C. PLEASE COME FASTING - we need to obtain cholesterol levels.

## 2024-04-08 NOTE — Progress Notes (Unsigned)
 Established Patient Office Visit  Subjective:  Patient ID: Elaine Kelly, female    DOB: 11/11/1985  Age: 38 y.o. MRN: [id number]  Chief Complaint  Patient presents with   Follow-up    4 week follow up. Pt is not fasting today.    HPI  Discussed the use of AI scribe software for clinical note transcription with the patient, who gave verbal consent to proceed.  History of Present Illness   Elaine Kelly is a 38 year old female with diabetes who presents with concerns about blood sugar management and persistent finger pain.  She started Ozempic  three weeks ago, currently on a dose of 0.25 mg once a week. Despite this, she feels excessively hungry and experiences episodes of shakiness. She does not have a glucometer to check her blood sugar levels during these episodes. Her last recorded A1c was 6.9%. She previously stopped taking metformin  due to adverse side effects and is currently on Ozempic  for blood sugar control. She has taken three injections since last visit.  She experiences headaches, particularly when she sleeps late or drinks coffee in the afternoon. She drinks two small cups of coffee daily and consumes about six to seven glasses of water. Headaches persist when her sleep is disrupted.  She reports persistent pain in her left pointer finger, present for several years, localized to the side of the finger and exacerbated by touch. Previous treatments with antibiotics for suspected infection were ineffective. She has not tried soaking the finger in Epsom salts. The pain is severe enough to cause dizziness when the finger is bumped. Previous xrays were also normal. She reports FROM of the digit, with no redness or swelling. No pain to tip of finger or nail, just the medial aspect.  She mentions swelling in her ankles, which reduces in the morning. She attributes this to prolonged standing while cooking, sometimes for up to three hours. She has not used compression socks.  She  recently started a new job at [employer], which requires her to be on her feet and walk around frequently.      Patient Active Problem List   Diagnosis Date Noted   Nexplanon  in place 01/02/2021   Indication for care in labor or delivery 11/20/2020   SVD (spontaneous vaginal delivery) 11/20/2020   Precipitous delivery 11/20/2020   [redacted] weeks gestation of pregnancy 10/02/2020   Polyhydramnios affecting pregnancy 09/10/2020   History of postpartum hemorrhage 08/06/2020   Gestational diabetes 08/06/2020   Group B streptococcal bacteriuria 07/31/2020   Supervision of high risk pregnancy, antepartum 07/25/2020   AMA (advanced maternal age) multigravida 35+ 07/25/2020   Elevated blood pressure reading in office without diagnosis of hypertension 07/25/2020   Obesity in pregnancy 07/25/2020   Language barrier, cultural differences 11/30/2011   Obese 11/25/2011   Past Medical History:  Diagnosis Date   Diabetes mellitus    Gestational diabetes    Postpartum hemorrhage    Thrombocytopenia (HCC)    Past Surgical History:  Procedure Laterality Date   NO PAST SURGERIES     Social History   Tobacco Use   Smoking status: Never   Smokeless tobacco: Never  Vaping Use   Vaping status: Never Used  Substance Use Topics   Alcohol use: No   Drug use: No      ROS: as noted in HPI  Objective:     BP 92/67   Pulse 94   Wt 234 lb 1.9 oz (106.2 kg)   SpO2 96%  BMI 37.79 kg/m  BP Readings from Last 3 Encounters:  04/08/24 92/67  03/11/24 110/77  02/26/24 116/78   Wt Readings from Last 3 Encounters:  04/08/24 234 lb 1.9 oz (106.2 kg)  03/11/24 235 lb 1.9 oz (106.6 kg)  02/26/24 231 lb 1.9 oz (104.8 kg)      Physical Exam Vitals and nursing note reviewed.  Constitutional:      General: She is not in acute distress.    Appearance: Normal appearance. She is obese. She is not ill-appearing, toxic-appearing or diaphoretic.  HENT:     Head: Normocephalic.      Mouth/Throat:     Mouth: Mucous membranes are moist.  Eyes:     General: No scleral icterus.       Right eye: No discharge.        Left eye: No discharge.     Extraocular Movements: Extraocular movements intact.     Pupils: Pupils are equal, round, and reactive to light.  Cardiovascular:     Rate and Rhythm: Normal rate.     Pulses: Normal pulses.     Heart sounds: No murmur heard. Pulmonary:     Effort: Pulmonary effort is normal. No respiratory distress.  Musculoskeletal:        General: Tenderness present. Normal range of motion.       Hands:     Right lower leg: Edema present.     Left lower leg: Edema present.     Comments: Non-pitting edema to B feet, equal bilaterally. No erythema or warmth. Tenderness to L medial distal digit near nail without erythema, discharge, drainage, swelling. FROM  Lymphadenopathy:     Cervical: No cervical adenopathy.  Skin:    General: Skin is warm and dry.  Neurological:     General: No focal deficit present.     Mental Status: She is alert and oriented to person, place, and time.      No results found for any visits on 04/08/24.  Last CBC Lab Results  Component Value Date   WBC 6.2 02/26/2024   HGB 13.2 02/26/2024   HCT 40.0 02/26/2024   MCV 83.9 02/26/2024   MCH 27.7 02/26/2024   RDW 11.9 02/26/2024   PLT 253 02/26/2024   Last metabolic panel Lab Results  Component Value Date   GLUCOSE 145 (H) 02/26/2024   NA 137 02/26/2024   K 4.6 02/26/2024   CL 103 02/26/2024   CO2 27 02/26/2024   BUN 11 02/26/2024   CREATININE 0.62 02/26/2024   EGFR 116 02/26/2024   CALCIUM 9.2 02/26/2024   PROT 6.6 02/26/2024   ALBUMIN 3.7 (L) 07/25/2020   LABGLOB 2.6 07/25/2020   AGRATIO 1.4 07/25/2020   BILITOT 0.2 02/26/2024   ALKPHOS 102 07/25/2020   AST 10 02/26/2024   ALT 12 02/26/2024   ANIONGAP 10 01/01/2019   Last lipids No results found for: "CHOL", "HDL", "LDLCALC", "LDLDIRECT", "TRIG", "CHOLHDL" Last hemoglobin A1c Lab  Results  Component Value Date   HGBA1C 6.9 (H) 02/26/2024   Last thyroid functions Lab Results  Component Value Date   TSH 0.52 02/26/2024   Last vitamin D No results found for: "25OHVITD2", "25OHVITD3", "VD25OH" Last vitamin B12 and Folate No results found for: "VITAMINB12", "FOLATE"    The ASCVD Risk score (Arnett DK, et al., 2019) failed to calculate for the following reasons:   The 2019 ASCVD risk score is only valid for ages 69 to 71  Assessment & Plan:  Type 2 diabetes mellitus  with other diabetic arthropathy, without long-term current use of insulin  (HCC) -     Blood Glucose Monitoring Suppl; Glucometer for once daily glucose checks. May substitute to any manufacturer covered by patient's insurance.  Dispense: 1 each; Refill: 0 -     Blood Glucose Test; For once daily glucose testing. May substitute to any manufacturer covered by patient's insurance.  Dispense: 90 strip; Refill: 3 -     Lancet Device; For once daily testing. May substitute to any manufacturer covered by patient's insurance.  Dispense: 1 each; Refill: 0 -     Lancets Misc.; For once daily testing. May substitute to any manufacturer covered by patient's insurance.  Dispense: 90 each; Refill: 20 -     Semaglutide (0.25 or 0.5MG /DOS); Inject 0.5 mg into the skin once a week.  Dispense: 3 mL; Refill: 0  Finger pain, left -     Lidocaine ; Apply 1 Application topically daily as needed. 2 hours before skin procedure.  Dispense: 50 g; Refill: 0 -     Ambulatory referral to Orthopedics  Dependent edema  Assessment and Plan    Type 2 Diabetes Mellitus Type 2 Diabetes Mellitus with A1c 6.9%. Possible hypoglycemia due to frequent hunger and shakiness. Currently on Ozempic  0.25 mg weekly, plans to increase to 0.5 mg for weight, inflammation, and glycemic control. Discontinued metformin . Lacks glucometer, not monitoring blood glucose during shakiness. Advised to limit sugar intake and avoid sugary beverages. - Order  glucometer kit with test strips and lancets. - Instruct to check blood glucose when feeling shaky, sweaty, weak, or nauseated. - Educate on blood glucose goals: fasting 90-120 mg/dL, postprandial <409 mg/dL. - Increase Ozempic  to 0.5 mg after the next dose. - Advise to limit sugar intake and avoid sugary beverages. - Schedule A1c recheck in late July.  Finger Pain Chronic pain in left index finger with increased sensitivity. Previous treatments ineffective. Paronychia not present, requires hand specialist evaluation. X-ray showed no abnormalities. - Recommend soaking finger in warm water with Epsom salts. - Refer to hand specialist for further evaluation. - Suggest using topical lidocaine  gel for pain relief.  Headaches Intermittent morning headaches linked to inadequate sleep and caffeine  consumption. Improvement noted but persists. Recommended increased water intake and reduced caffeine  consumption. - Advise to avoid caffeine  after 3 PM. - Encourage increasing water intake to 3 liters per day.  Peripheral Edema Ankle swelling after prolonged standing, reduces in morning. Related to prolonged standing due to cooking and work. - Recommend compression socks to be worn in the morning before swelling occurs.  General Health Maintenance Lifestyle modifications discussed for diabetes management and overall health. Encouraged physical activity through work and dietary modifications to reduce sugar intake. - Encourage physical activity through work. - Advise on dietary modifications to reduce sugar intake.         Return in about 2 months (around 06/08/2024).   Mandy Second, PA

## 2024-06-10 ENCOUNTER — Other Ambulatory Visit (HOSPITAL_COMMUNITY): Payer: Self-pay

## 2024-06-10 ENCOUNTER — Encounter: Payer: Self-pay | Admitting: Family Medicine

## 2024-06-10 ENCOUNTER — Ambulatory Visit (INDEPENDENT_AMBULATORY_CARE_PROVIDER_SITE_OTHER): Admitting: Family Medicine

## 2024-06-10 VITALS — BP 112/70 | HR 87 | Temp 98.2°F | Ht 66.45 in | Wt 234.0 lb

## 2024-06-10 DIAGNOSIS — E119 Type 2 diabetes mellitus without complications: Secondary | ICD-10-CM

## 2024-06-10 DIAGNOSIS — I872 Venous insufficiency (chronic) (peripheral): Secondary | ICD-10-CM

## 2024-06-10 DIAGNOSIS — Z7689 Persons encountering health services in other specified circumstances: Secondary | ICD-10-CM

## 2024-06-10 DIAGNOSIS — I8393 Asymptomatic varicose veins of bilateral lower extremities: Secondary | ICD-10-CM

## 2024-06-10 DIAGNOSIS — Z603 Acculturation difficulty: Secondary | ICD-10-CM | POA: Diagnosis not present

## 2024-06-10 DIAGNOSIS — B351 Tinea unguium: Secondary | ICD-10-CM

## 2024-06-10 DIAGNOSIS — Z23 Encounter for immunization: Secondary | ICD-10-CM | POA: Diagnosis not present

## 2024-06-10 DIAGNOSIS — R300 Dysuria: Secondary | ICD-10-CM

## 2024-06-10 LAB — POCT GLYCOSYLATED HEMOGLOBIN (HGB A1C)
HbA1c POC (<> result, manual entry): 6.1 % (ref 4.0–5.6)
HbA1c, POC (controlled diabetic range): 6.1 % (ref 0.0–7.0)
HbA1c, POC (prediabetic range): 6.1 % (ref 5.7–6.4)
Hemoglobin A1C: 6.1 % — AB (ref 4.0–5.6)

## 2024-06-10 MED ORDER — ONDANSETRON 4 MG PO TBDP: 4.0000 mg | ORAL_TABLET | Freq: Three times a day (TID) | ORAL | 1 refills | Status: AC | PRN

## 2024-06-10 MED ORDER — CLOTRIMAZOLE 1 % EX CREA: 1.0000 | TOPICAL_CREAM | Freq: Two times a day (BID) | CUTANEOUS | 0 refills | Status: AC

## 2024-06-10 MED ORDER — SEMAGLUTIDE (1 MG/DOSE) 4 MG/3ML ~~LOC~~ SOPN: 1.0000 mg | PEN_INJECTOR | SUBCUTANEOUS | 5 refills | Status: AC

## 2024-06-10 NOTE — Patient Instructions (Addendum)
 Spray your shoes with lamisil spray daily also for 4 weeks with the cream.   Return in about 13 weeks (around 09/09/2024).        Great to see you today.  I have refilled the medication(s) we provide.   If labs were collected or images ordered, we will inform you of  results once we have received them and reviewed. We will contact you either by echart message, or telephone call.  Please give ample time to the testing facility, and our office to run,  receive and review results. Please do not call inquiring of results, even if you can see them in your chart. We will contact you as soon as we are able. If it has been over 1 week since the test was completed, and you have not yet heard from us , then please call us .    - echart message- for normal results that have been seen by the patient already.   - telephone call: abnormal results or if patient has not viewed results in their echart.  If a referral to a specialist was entered for you, please call us  in 2 weeks if you have not heard from the specialist office to schedule.

## 2024-06-10 NOTE — Progress Notes (Signed)
 Patient ID: Elaine Kelly, female  DOB: [birth year], 38 y.o.   MRN: [id number] Patient Care Team    Relationship Specialty Notifications Start End  Catherine Charlies LABOR, DO PCP - General Family Medicine  06/10/24     Chief Complaint  Patient presents with   Diabetes    Subjective:  Elaine Kelly is a 38 y.o.  female present for TOC. All past medical history, surgical history, allergies, family history, immunizations, medications and social history were updated in the electronic medical record today. All recent labs, ED visits and hospitalizations within the last year were reviewed.  Diabetes A1c 6.9.  Started on Ozempic  Pt reports compliance with Ozempic  0.5 mg weekly injection. Denies numbness, tingling of extremities, hypo/hyperglycemic events or non-healing wounds.  Reports she has had some bloating, gassiness sometimes diarrhea sometimes constipation.   Venous insufficiency/asymptomatic varicose veins Patient reports when she stands on her feet for long periods of time she has swelling in her legs and her veins in her legs  Dysuria Patient reports she has noticed over the last couple days burning with urination.  No fevers or chills.  Fungal infection of toenail Patient reports a rash between her 4th and 5th toe on her right foot.  She believes it is yeast     02/26/2024    2:52 PM 12/30/2023    5:18 PM 11/08/2020   12:03 PM 10/25/2020   10:36 AM 09/17/2020   10:28 AM  Depression screen PHQ 2/9  Decreased Interest 0 0 0 0 0  Down, Depressed, Hopeless 0 0 0 0 0  PHQ - 2 Score 0 0 0 0 0  Altered sleeping 0 0 2 1 1   Tired, decreased energy 0 0 0 1 1  Change in appetite 0 0 0 0 1  Feeling bad or failure about yourself  0 0 0 0 0  Trouble concentrating 0 0 0 0 0  Moving slowly or fidgety/restless 0 0 0 0 0  Suicidal thoughts 0 0 0 0 0  PHQ-9 Score 0 0 2 2 3   Difficult doing work/chores Not difficult at all          02/26/2024    2:53 PM 12/30/2023    5:18 PM  11/08/2020   12:03 PM 10/25/2020   10:36 AM  GAD 7 : Generalized Anxiety Score  Nervous, Anxious, on Edge 0 0 0 0  Control/stop worrying 0 0 0 0  Worry too much - different things 0 0 0 0  Trouble relaxing 0 0 0 0  Restless 0 0 0 0  Easily annoyed or irritable 0 0 0 0  Afraid - awful might happen 0 0 0 0  Total GAD 7 Score 0 0 0 0  Anxiety Difficulty Not difficult at all              02/26/2024    2:52 PM 12/23/2018    3:20 PM  Fall Risk   Falls in the past year? 0 0   Number falls in past yr: 0   Injury with Fall? 0   Risk for fall due to : No Fall Risks   Follow up Falls evaluation completed      Data saved with a previous flowsheet row definition    Immunization History  Administered Date(s) Administered   Influenza,inj,Quad PF,6+ Mos 12/30/2018, 08/06/2020   Influenza-Unspecified 10/03/2013   Moderna Sars-Covid-2 Vaccination 09/06/2020, 10/10/2020   PNEUMOCOCCAL CONJUGATE-20 06/10/2024   PPD Test 09/03/2021   Tdap 10/03/2013,  12/30/2018, 09/17/2020    No results found.  Past Medical History:  Diagnosis Date   AMA (advanced maternal age) multigravida 35+ 07/25/2020   Diabetes mellitus    Gestational diabetes    Group B streptococcal bacteriuria 07/31/2020   History of postpartum hemorrhage 08/06/2020   Polyhydramnios affecting pregnancy 09/10/2020   AFI =30 at 25wk     Postpartum hemorrhage    Precipitous delivery 11/20/2020   Thrombocytopenia (HCC)    No Known Allergies Past Surgical History:  Procedure Laterality Date   NO PAST SURGERIES     Family History  Problem Relation Age of Onset   Hypertension Father    Cancer Maternal Aunt        leaukemia   Diabetes Paternal Grandmother    Diabetes Mother    Hypertension Mother    Hyperlipidemia Mother    Social History   Social History Narrative   ** Merged History Encounter **        Allergies as of 06/10/2024   No Known Allergies      Medication List        Accurate as of June 10, 2024  4:56 PM. If you have any questions, ask your nurse or doctor.          STOP taking these medications    ibuprofen  600 MG tablet Commonly known as: ADVIL  Stopped by: Charlies Bellini   Lancet Device Misc Stopped by: Charlies Bellini   lidocaine  5 % ointment Commonly known as: XYLOCAINE  Stopped by: Elzie Sheets   mupirocin  ointment 2 % Commonly known as: BACTROBAN  Stopped by: Chyann Ambrocio   Semaglutide (0.25 or 0.5MG /DOS) 2 MG/3ML Sopn Replaced by: Semaglutide  (1 MG/DOSE) 4 MG/3ML Sopn Stopped by: Charlies Bellini       TAKE these medications    Blood Glucose Monitoring Suppl Devi Glucometer for once daily glucose checks. May substitute to any manufacturer covered by patient's insurance.   BLOOD GLUCOSE TEST STRIPS Strp For once daily glucose testing. May substitute to any manufacturer covered by patient's insurance.   butalbital -acetaminophen -caffeine  50-325-40 MG tablet Commonly known as: FIORICET Take 1 tablet by mouth every 6 (six) hours as needed for headache. Do not exceed 4 in a week   clotrimazole  1 % cream Commonly known as: Clotrimazole  Anti-Fungal Apply 1 Application topically 2 (two) times daily for 28 days. Started by: Charlies Bellini   cyclobenzaprine  10 MG tablet Commonly known as: FLEXERIL  Take 1 tablet (10 mg total) by mouth 3 (three) times daily as needed for muscle spasms.   Lancets Misc. Misc For once daily testing. May substitute to any manufacturer covered by patient's insurance.   norethindrone  0.35 MG tablet Commonly known as: MICRONOR  Take 1 tablet (0.35 mg total) by mouth daily.   ondansetron  4 MG disintegrating tablet Commonly known as: ZOFRAN -ODT Take 1 tablet (4 mg total) by mouth every 8 (eight) hours as needed for nausea or vomiting. Started by: Naina Sleeper   Semaglutide  (1 MG/DOSE) 4 MG/3ML Sopn Inject 1 mg as directed once a week. Replaces: Semaglutide (0.25 or 0.5MG /DOS) 2 MG/3ML Sopn Started by: Charlies Bellini        All  past medical history, surgical history, allergies, family history, immunizations andmedications were updated in the EMR today and reviewed under the history and medication portions of their EMR.    Recent Results (from the past 2160 hours)  POCT HgB A1C     Status: Abnormal   Collection Time: 06/10/24  2:33 PM  Result Value Ref Range  Hemoglobin A1C 6.1 (A) 4.0 - 5.6 %   HbA1c POC (<> result, manual entry) 6.1 4.0 - 5.6 %   HbA1c, POC (prediabetic range) 6.1 5.7 - 6.4 %   HbA1c, POC (controlled diabetic range) 6.1 0.0 - 7.0 %    No results found.   ROS 14 pt review of systems performed and negative (unless mentioned in an HPI)  Objective: BP 112/70   Pulse 87   Temp 98.2 F (36.8 C)   Ht 5' 6.45 (1.688 m)   Wt 234 lb (106.1 kg)   SpO2 99%   BMI 37.26 kg/m  Physical Exam Vitals and nursing note reviewed.  Constitutional:      General: She is not in acute distress.    Appearance: Normal appearance. She is not ill-appearing, toxic-appearing or diaphoretic.  HENT:     Head: Normocephalic and atraumatic.  Eyes:     General: No scleral icterus.       Right eye: No discharge.        Left eye: No discharge.     Extraocular Movements: Extraocular movements intact.     Conjunctiva/sclera: Conjunctivae normal.     Pupils: Pupils are equal, round, and reactive to light.  Cardiovascular:     Rate and Rhythm: Normal rate and regular rhythm.     Heart sounds: No murmur heard. Pulmonary:     Effort: Pulmonary effort is normal. No respiratory distress.     Breath sounds: Normal breath sounds. No wheezing, rhonchi or rales.  Musculoskeletal:     Right lower leg: No edema.     Left lower leg: No edema.  Skin:    General: Skin is warm.     Findings: No rash.     Comments: Right foot: rash between 4-5th toe.   Neurological:     Mental Status: She is alert and oriented to person, place, and time. Mental status is at baseline.     Motor: No weakness.     Gait: Gait normal.   Psychiatric:        Mood and Affect: Mood normal.        Behavior: Behavior normal.        Thought Content: Thought content normal.        Judgment: Judgment normal.      Diabetic Foot Exam - Simple   Simple Foot Form Diabetic Foot exam was performed with the following findings: Yes 06/10/2024  2:17 PM  Visual Inspection No deformities, no ulcerations, no other skin breakdown bilaterally: Yes Sensation Testing Intact to touch and monofilament testing bilaterally: Yes Pulse Check Posterior Tibialis and Dorsalis pulse intact bilaterally: Yes Comments     Assessment/plan: Elaine Kelly is a 38 y.o. female present for TOC- Chronic Conditions/illness Management Type 2 diabetes mellitus without complication, without long-term current use of insulin  (HCC) Increase Ozempic  to 1 mg daily. Hydrate well.  Low glycemic index diet discussed.  Consider daily stool softener. - POCT HgB A1C - Urine Microalbumin w/creat. ratio - Ambulatory referral to Ophthalmology PNA series: completed today Flu shot:  (recommneded yearly) BMP: 02/26/2024- completed Foot exam: 06/10/2024- completed Eye exam:  referral placed A1c: 6.9> 6.1 today Language barrier, cultural differences Interpreter present Need for vaccination for pneumococcus - Pneumococcal conjugate vaccine 20-valent  Venous insufficiency/asymptomatic varicose veins Pression stockings printed - Compression stockings  Dysuria If urinalysis positive we will call in antibiotic. - Urinalysis w microscopic + reflex cultur  Fungal infection of toenail Clotrimazole  cream provided. Encouraged her to purchase Lamisil  spray and spray her shoes daily.    Return in about 13 weeks (around 09/09/2024).  Orders Placed This Encounter  Procedures   Compression stockings   Pneumococcal conjugate vaccine 20-valent   Urine Microalbumin w/creat. ratio   Urinalysis w microscopic + reflex cultur   Urine Microalbumin w/creat. ratio   Ambulatory  referral to Ophthalmology   POCT HgB A1C   Meds ordered this encounter  Medications   clotrimazole  (CLOTRIMAZOLE  ANTI-FUNGAL) 1 % cream    Sig: Apply 1 Application topically 2 (two) times daily for 28 days.    Dispense:  60 g    Refill:  0   Semaglutide , 1 MG/DOSE, 4 MG/3ML SOPN    Sig: Inject 1 mg as directed once a week.    Dispense:  3 mL    Refill:  5   ondansetron  (ZOFRAN -ODT) 4 MG disintegrating tablet    Sig: Take 1 tablet (4 mg total) by mouth every 8 (eight) hours as needed for nausea or vomiting.    Dispense:  20 tablet    Refill:  1   Referral Orders         Ambulatory referral to Ophthalmology       Note is dictated utilizing voice recognition software. Although note has been proof read prior to signing, occasional typographical errors still can be missed. If any questions arise, please do not hesitate to call for verification.  Electronically signed by: Charlies Bellini, DO [facility] [place]

## 2024-06-11 LAB — MICROALBUMIN / CREATININE URINE RATIO
Creatinine, Urine: 73 mg/dL (ref 20–275)
Microalb Creat Ratio: 4 mg/g{creat} (ref <30)
Microalb, Ur: 0.3 mg/dL

## 2024-06-12 LAB — URINE CULTURE
MICRO NUMBER:: 16809667
Result:: NO GROWTH
SPECIMEN QUALITY:: ADEQUATE

## 2024-06-12 LAB — URINALYSIS W MICROSCOPIC + REFLEX CULTURE
Bacteria, UA: NONE SEEN /HPF
Bilirubin Urine: NEGATIVE
Glucose, UA: NEGATIVE
Hgb urine dipstick: NEGATIVE
Hyaline Cast: NONE SEEN /LPF
Ketones, ur: NEGATIVE
Nitrites, Initial: NEGATIVE
Protein, ur: NEGATIVE
RBC / HPF: NONE SEEN /HPF (ref 0–2)
Specific Gravity, Urine: 1.012 (ref 1.001–1.035)
pH: 6.5 (ref 5.0–8.0)

## 2024-06-12 LAB — CULTURE INDICATED

## 2024-06-13 ENCOUNTER — Ambulatory Visit: Payer: Self-pay | Admitting: Family Medicine

## 2024-06-13 MED ORDER — FLUCONAZOLE 150 MG PO TABS: 150.0000 mg | ORAL_TABLET | Freq: Once | ORAL | 0 refills | Status: AC

## 2024-06-23 ENCOUNTER — Ambulatory Visit (INDEPENDENT_AMBULATORY_CARE_PROVIDER_SITE_OTHER): Payer: Medicaid Other | Admitting: Dermatology

## 2024-06-23 ENCOUNTER — Encounter: Payer: Self-pay | Admitting: Dermatology

## 2024-06-23 VITALS — BP 141/93

## 2024-06-23 DIAGNOSIS — L03012 Cellulitis of left finger: Secondary | ICD-10-CM | POA: Diagnosis not present

## 2024-06-23 DIAGNOSIS — M79644 Pain in right finger(s): Secondary | ICD-10-CM

## 2024-06-23 MED ORDER — PREDNISONE 10 MG PO TABS: ORAL_TABLET | ORAL | 0 refills | Status: AC

## 2024-06-23 MED ORDER — CLOBETASOL PROPIONATE 0.05 % EX OINT: TOPICAL_OINTMENT | CUTANEOUS | 0 refills | Status: AC

## 2024-06-23 NOTE — Patient Instructions (Addendum)
 ???????: ?????? ?? ????? ????  ?????? ????????  ????? ???????? ?????. ????? ???? ????????? ????????:  - ???????: - ?? ???? ?????????? ??? ??????? ??????? ?? ?????? ?? ????. - ????? ????????? ??? ???: - ? ????? (?? ???) ?? ???? ???? ? ????. - ????? (?? ???) ?? ???? ???? ? ???? ?????. - ??? ???? (?? ???) ?? ???? ???? ? ???? ?????.  - ??????? ????????: - ???? ????? ?????? ?? ??? ?? ???? ???? ??? ???? ???? ?? ????? ?? ????. - ????? ??? ?????. - ????? ??????.  - ??????? ?? ??? ??????: - ???? ???????? ????????? ????? ??????.  - ????????: - ?????? ????? ???????? ??? ???.  ???? ??????? ???? ??? ???? ????? ?? ????? ?? ?????????.  ?? ???? ????????  ?. ?????? ????? ??? ??????? ???????   Date: Thu Jun 23 2024  Hello [place],  Thank you for visiting today. Here is a summary of the key instructions:  - Medications:   - Apply clobetasol  ointment to affected area on finger every night   - Take prednisone  as follows:     - 3 tablets (30 mg) every morning for 5 days     - 2 tablets (20 mg) every morning for next 5 days     - 1 tablet (10 mg) every morning for final 5 days  - Home Care:   - Soak affected nail in half warm vinegar, half warm water for 10 minutes each night   - Rinse after soaking   - Cover with a band-aid  - Lifestyle Changes:   - Avoid sugars and sweets due to diabetes  - Follow-up:   - Return for follow-up appointment in one month  Please reach out if you have any questions or concerns.  Warm regards,  Dr. Delon Lenis Dermat[facility] Important Information  Due to recent changes in healthcare laws, you may see results of your pathology and/or laboratory studies on MyChart before the doctors have had a chance to review them. We understand that in some cases there may be results that are confusing or concerning to you. Please understand that not all results are received at the same time and often the doctors may need to interpret multiple results in order  to provide you with the best plan of care or course of treatment. Therefore, we ask that you please give us  2 business days to thoroughly review all your results before contacting the office for clarification. Should we see a critical lab result, you will be contacted sooner.   If You Need Anything After Your Visit  If you have any questions or concerns for your doctor, please call our main line at [phone] If no one answers, please leave a voicemail as directed and we will return your call as soon as possible. Messages left after 4 pm will be answered the following business day.   You may also send us  a message via MyChart. We typically respond to MyChart messages within 1-2 business days.  For prescription refills, please ask your pharmacy to contact our office. Our fax number is [phone].  If you have an urgent issue when the clinic is closed that cannot wait until the next business day, you can page your doctor at the number below.    Please note that while we do our best to be available for urgent issues outside of office hours, we are not available 24/7.   If you have an urgent issue and are unable to reach us , you may choose to  seek medical care at your doctor's office, retail clinic, urgent care center, or emergency room.  If you have a medical emergency, please immediately call 911 or go to the emergency department. In the event of inclement weather, please call our main line at [phone] for an update on the status of any delays or closures.  Dermatology Medication Tips: Please keep the boxes that topical medications come in in order to help keep track of the instructions about where and how to use these. Pharmacies typically print the medication instructions only on the boxes and not directly on the medication tubes.   If your medication is too expensive, please contact our office at [phone] or send us  a message through MyChart.   We are unable to tell what your  co-pay for medications will be in advance as this is different depending on your insurance coverage. However, we may be able to find a substitute medication at lower cost or fill out paperwork to get insurance to cover a needed medication.   If a prior authorization is required to get your medication covered by your insurance company, please allow us  1-2 business days to complete this process.  Drug prices often vary depending on where the prescription is filled and some pharmacies may offer cheaper prices.  The website www.goodrx.com contains coupons for medications through different pharmacies. The prices here do not account for what the cost may be with help from insurance (it may be cheaper with your insurance), but the website can give you the price if you did not use any insurance.  - You can print the associated coupon and take it with your prescription to the pharmacy.  - You may also stop by our office during regular business hours and pick up a GoodRx coupon card.  - If you need your prescription sent electronically to a different pharmacy, notify our office through [facility] or by phone at [phone]

## 2024-06-23 NOTE — Progress Notes (Signed)
 New Patient Visit   Subjective  Elaine Kelly is a 38 y.o. female, accompanied by interpreter, who presents for a NEW PATIENT appointment to be examined for the concerns as listed below.   Ingrown Nail: Located at the L pointer finger that presented 4 years ago. The nail is painful but ranges between 7-10 out of 10 depending on the activity she is doing with her hands. She has seen her PCP previously 2 years ago and was given a pill but she is unsure of the name. She stated she visited her PCP again recently who ordered referral to dermatology.    Are you nursing, pregnant or trying to conceive? No   Patient denied Hx of Bx. Patient denied family Hx of skin cancer.   The following portions of the chart were reviewed this encounter and updated as appropriate: medications, allergies, medical history  Review of Systems:  No other skin or systemic complaints except as noted in HPI or Assessment and Plan.  Objective  Well appearing patient in no apparent distress; mood and affect are within normal limits.    A focused examination was performed of the following areas: L pointer finger   Relevant exam findings are noted in the Assessment and Plan.  Narrative & Impression  CLINICAL DATA:  Crush injury 2 months ago, second digit pain and swelling   EXAM: LEFT INDEX FINGER 2+V   COMPARISON:  None Available.   FINDINGS: Frontal, oblique, and lateral views of the left second digit are obtained. No acute fracture, subluxation, or dislocation. Joint spaces are well preserved. Soft tissues are unremarkable.   IMPRESSION: 1. Unremarkable left second digit.     Electronically Signed   By: Ozell Daring M.D.   On: 12/19/2022 15:43           Assessment & Plan    1. Paronychia (Nail Skin Fold Pain / Tenderness) - Assessment: Patient reports nail issue on 2nd digit of left hand ongoing for couple of years with recent severe exacerbation. Previous oral medications from  PCP ineffective. Continuous severe pain past 3-4 days affecting sleep. Physical examination reveals lateral tenderness with edema, no erythema or purulence. Patient reports sharp sensation internally. February x-ray negative for foreign object. Differential includes paronychia, onychomycosis, or other nail disorders.  - Plan:    Apply clobetasol  ointment to affected area nightly    Perform vinegar soak (half warm vinegar, half warm water) for 10 minutes nightly, followed by rinsing and covering with band-aid    Prednisone  taper: 30 mg PO daily for 5 days, then 20 mg PO daily for 5 days, then 10 mg PO daily for 5 days    Advise patient to monitor blood sugar levels and avoid sugars and sweets due to diabetes    If no improvement, consider direct medication injection or referral to hand surgeon  Follow-up in one month to assess response to treatment.  Pre-visit planning reviewing the last office visit, labs, imaging and care everywhere when applicable was 5 minutes Intra-visit was 30 minutes and included updating the relevant history, performing a video or in-person physical exam as appropriate, creating a treatment plan and used shared decision making with the patient.  Post-visit was 5 minutes that encompassed note completion, placing of orders, updating patient instructions and coordination of care.    No follow-ups on file.   Documentation: I have reviewed the above documentation for accuracy and completeness, and I agree with the above.  I, Shirron Maranda, CMA, am acting as  scribe for Delon Lenis, DO.   Delon Lenis, DO

## 2024-06-23 NOTE — Progress Notes (Deleted)
   New Patient Visit   Subjective  Elaine Kelly is a 38 y.o. female who presents for the following: ***  Patient states {he/she/they:23295} has *** located at the {LOCATION ON BODY:21951} that {he/she/they:23295} would like to have examined. Patient reports the areas have been there for {NUMBER 1-10:22536} {Time; units week/month/year w plurals:19499}. {He/she (caps):30048} reports the areas {Actions; are/are not:16769} bothersome.Patient rates irritation {NUMBER 1-10:22536} out of 10. {He/she (caps):30048} states that the areas {ACTIONS; HAVE/HAVE NOT:19434} spread. Patient reports {he/she/they:23295} {HAS HAS WNU:81165} previously been treated for these areas. Patient *** Hx of bx. Patient *** family history of skin cancer(s).  The patient has spots, moles and lesions to be evaluated, some may be new or changing and the patient may have concern these could be cancer.   The following portions of the chart were reviewed this encounter and updated as appropriate: medications, allergies, medical history  Review of Systems:  No other skin or systemic complaints except as noted in HPI or Assessment and Plan.  Objective  Well appearing patient in no apparent distress; mood and affect are within normal limits.  ***A full examination was performed including scalp, head, eyes, ears, nose, lips, neck, chest, axillae, abdomen, back, buttocks, bilateral upper extremities, bilateral lower extremities, hands, feet, fingers, toes, fingernails, and toenails. All findings within normal limits unless otherwise noted below.  ***A focused examination was performed of the following areas: ***  Relevant exam findings are noted in the Assessment and Plan.    Assessment & Plan       No follow-ups on file.  ***  Documentation: I have reviewed the above documentation for accuracy and completeness, and I agree with the above.  Delon Lenis, DO

## 2024-07-28 ENCOUNTER — Ambulatory Visit: Admitting: Dermatology
